# Patient Record
Sex: Male | Born: 2000 | Race: Black or African American | Hispanic: No | Marital: Single | Smoking: Former smoker
Health system: Southern US, Community
[De-identification: ages and names within clinical notes are randomized; demographics above are authoritative.]

## PROBLEM LIST (undated history)

## (undated) DIAGNOSIS — D571 Sickle-cell disease without crisis: Secondary | ICD-10-CM

## (undated) HISTORY — PX: NO PAST SURGERIES: SHX2092

---
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
## 2018-01-06 ENCOUNTER — Encounter: Payer: Self-pay | Admitting: *Deleted

## 2018-01-06 ENCOUNTER — Emergency Department
Admission: EM | Admit: 2018-01-06 | Discharge: 2018-01-06 | Disposition: A | Discharge status: Home / Self Care | Payer: Medicaid Other | Attending: Emergency Medicine | Admitting: Emergency Medicine

## 2018-01-06 ENCOUNTER — Other Ambulatory Visit: Payer: Self-pay

## 2018-01-06 ENCOUNTER — Emergency Department
Admission: EM | Admit: 2018-01-06 | Discharge: 2018-01-07 | Disposition: A | Discharge status: Home / Self Care | Payer: Medicaid Other | Source: Home / Self Care | Attending: Emergency Medicine | Admitting: Emergency Medicine

## 2018-01-06 ENCOUNTER — Encounter: Payer: Self-pay | Admitting: Emergency Medicine

## 2018-01-06 DIAGNOSIS — M545 Low back pain: Secondary | ICD-10-CM | POA: Diagnosis not present

## 2018-01-06 DIAGNOSIS — F17200 Nicotine dependence, unspecified, uncomplicated: Secondary | ICD-10-CM

## 2018-01-06 DIAGNOSIS — Z79899 Other long term (current) drug therapy: Secondary | ICD-10-CM | POA: Insufficient documentation

## 2018-01-06 DIAGNOSIS — D5700 Hb-SS disease with crisis, unspecified: Secondary | ICD-10-CM | POA: Insufficient documentation

## 2018-01-06 LAB — CBC WITH DIFFERENTIAL/PLATELET
Abs Immature Granulocytes: 0.03 10*3/uL (ref 0.00–0.07)
BASOS ABS: 0.1 10*3/uL (ref 0.0–0.1)
Basophils Relative: 1 %
EOS PCT: 12 %
Eosinophils Absolute: 1.4 10*3/uL — ABNORMAL HIGH (ref 0.0–0.5)
HEMATOCRIT: 28.5 % — AB (ref 39.0–52.0)
HEMOGLOBIN: 10 g/dL — AB (ref 13.0–17.0)
Immature Granulocytes: 0 %
LYMPHS ABS: 3.4 10*3/uL (ref 0.7–4.0)
LYMPHS PCT: 30 %
MCH: 30.4 pg (ref 26.0–34.0)
MCHC: 35.1 g/dL (ref 30.0–36.0)
MCV: 86.6 fL (ref 80.0–100.0)
MONO ABS: 1.6 10*3/uL — AB (ref 0.1–1.0)
MONOS PCT: 14 %
Neutro Abs: 4.9 10*3/uL (ref 1.7–7.7)
Neutrophils Relative %: 43 %
Platelets: 342 10*3/uL (ref 150–400)
RBC: 3.29 MIL/uL — ABNORMAL LOW (ref 4.22–5.81)
RDW: 19.6 % — ABNORMAL HIGH (ref 11.5–15.5)
WBC: 11.4 10*3/uL — ABNORMAL HIGH (ref 4.0–10.5)
nRBC: 1 % — ABNORMAL HIGH (ref 0.0–0.2)

## 2018-01-06 LAB — RETICULOCYTES
Immature Retic Fract: 34.1 % — ABNORMAL HIGH (ref 2.3–15.9)
RBC.: 3.29 MIL/uL — AB (ref 4.22–5.81)
Retic Count, Absolute: 410.6 10*3/uL — ABNORMAL HIGH (ref 19.0–186.0)
Retic Ct Pct: 12.5 % — ABNORMAL HIGH (ref 0.4–3.1)

## 2018-01-06 LAB — COMPREHENSIVE METABOLIC PANEL
ALK PHOS: 58 U/L (ref 38–126)
ALT: 24 U/L (ref 0–44)
AST: 39 U/L (ref 15–41)
Albumin: 4.9 g/dL (ref 3.5–5.0)
Anion gap: 8 (ref 5–15)
BUN: 8 mg/dL (ref 6–20)
CHLORIDE: 107 mmol/L (ref 98–111)
CO2: 26 mmol/L (ref 22–32)
CREATININE: 0.72 mg/dL (ref 0.61–1.24)
Calcium: 9.5 mg/dL (ref 8.9–10.3)
GFR calc Af Amer: >60 mL/min (ref >60)
GFR calc non Af Amer: >60 mL/min (ref >60)
GLUCOSE: 89 mg/dL (ref 70–99)
Potassium: 4.1 mmol/L (ref 3.5–5.1)
SODIUM: 141 mmol/L (ref 135–145)
Total Bilirubin: 1.2 mg/dL (ref 0.3–1.2)
Total Protein: 8.7 g/dL — ABNORMAL HIGH (ref 6.5–8.1)

## 2018-01-06 MED ORDER — HYDROMORPHONE HCL 1 MG/ML IJ SOLN
2.0000 mg | Freq: Once | INTRAMUSCULAR | Status: AC
  Administered 2018-01-07: 2 mg via SUBCUTANEOUS
  Filled 2018-01-06: qty 2

## 2018-01-06 MED ORDER — IBUPROFEN 600 MG PO TABS
600.0000 mg | ORAL_TABLET | Freq: Once | ORAL | Status: AC
  Administered 2018-01-07: 600 mg via ORAL
  Filled 2018-01-06: qty 1

## 2018-01-06 MED ORDER — HYDROXYUREA 500 MG PO CAPS: 1000.0000 mg | ORAL_CAPSULE | Freq: Every day | ORAL | 0 refills | Status: AC

## 2018-01-06 MED ORDER — OXYCODONE HCL 5 MG PO TABS: 5.0000 mg | ORAL_TABLET | Freq: Four times a day (QID) | ORAL | 0 refills | Status: AC | PRN

## 2018-01-06 MED ORDER — OXYCODONE HCL ER 40 MG PO T12A
40.0000 mg | EXTENDED_RELEASE_TABLET | Freq: Once | ORAL | Status: AC
  Administered 2018-01-07: 40 mg via ORAL
  Filled 2018-01-06: qty 1

## 2018-01-06 NOTE — Discharge Instructions (Signed)
Please resume taking your hydroxyurea every day to help prevent these crises from happening in the first place.  Take your pain medication as needed for severe symptoms and make sure you remain well-hydrated.  Follow-up with your primary care physician as needed and return to the emergency department for any concerns.  It was a pleasure to take care of you today, and thank you for coming to our emergency department.  If you have any questions or concerns before leaving please ask the nurse to grab me and I'm more than happy to go through your aftercare instructions again.  If you were prescribed any opioid pain medication today such as Norco, Vicodin, Percocet, morphine, hydrocodone, or oxycodone please make sure you do not drive when you are taking this medication as it can alter your ability to drive safely.  If you have any concerns once you are home that you are not improving or are in fact getting worse before you can make it to your follow-up appointment, please do not hesitate to call [phone] and come back for further evaluation.  Merrily Brittle, MD  Results for orders placed or performed during the hospital encounter of 01/06/18  Comprehensive metabolic panel  Result Value Ref Range   Sodium 141 135 - 145 mmol/L   Potassium 4.1 3.5 - 5.1 mmol/L   Chloride 107 98 - 111 mmol/L   CO2 26 22 - 32 mmol/L   Glucose, Bld 89 70 - 99 mg/dL   BUN 8 6 - 20 mg/dL   Creatinine, Ser 4.09 0.61 - 1.24 mg/dL   Calcium 9.5 8.9 - 81.1 mg/dL   Total Protein 8.7 (H) 6.5 - 8.1 g/dL   Albumin 4.9 3.5 - 5.0 g/dL   AST 39 15 - 41 U/L   ALT 24 0 - 44 U/L   Alkaline Phosphatase 58 38 - 126 U/L   Total Bilirubin 1.2 0.3 - 1.2 mg/dL   GFR calc non Af Amer >60 >60 mL/min   GFR calc Af Amer >60 >60 mL/min   Anion gap 8 5 - 15  CBC with Differential  Result Value Ref Range   WBC 11.4 (H) 4.0 - 10.5 K/uL   RBC 3.29 (L) 4.22 - 5.81 MIL/uL   Hemoglobin 10.0 (L) 13.0 - 17.0 g/dL   HCT 91.4 (L) 78.2 - 95.6 %   MCV  86.6 80.0 - 100.0 fL   MCH 30.4 26.0 - 34.0 pg   MCHC 35.1 30.0 - 36.0 g/dL   RDW 21.3 (H) 08.6 - 57.8 %   Platelets 342 150 - 400 K/uL   nRBC 1.0 (H) 0.0 - 0.2 %   Neutrophils Relative % 43 %   Neutro Abs 4.9 1.7 - 7.7 K/uL   Lymphocytes Relative 30 %   Lymphs Abs 3.4 0.7 - 4.0 K/uL   Monocytes Relative 14 %   Monocytes Absolute 1.6 (H) 0.1 - 1.0 K/uL   Eosinophils Relative 12 %   Eosinophils Absolute 1.4 (H) 0.0 - 0.5 K/uL   Basophils Relative 1 %   Basophils Absolute 0.1 0.0 - 0.1 K/uL   Immature Granulocytes 0 %   Abs Immature Granulocytes 0.03 0.00 - 0.07 K/uL  Reticulocytes  Result Value Ref Range   Retic Ct Pct 12.5 (H) 0.4 - 3.1 %   RBC. 3.29 (L) 4.22 - 5.81 MIL/uL   Retic Count, Absolute 410.6 (H) 19.0 - 186.0 K/uL   Immature Retic Fract 34.1 (H) 2.3 - 15.9 %

## 2018-01-06 NOTE — ED Triage Notes (Signed)
Patient was here earlier but had to leave and came back. Patient is having back pain that has been going on for 3 weeks and has hx of sickle cell.

## 2018-01-06 NOTE — Discharge Instructions (Addendum)
Please return to the ER for treatment with fluids and pain medication as soon as you are able.  Return to the ER immediately for any new or worsening symptoms that concern you.

## 2018-01-06 NOTE — ED Triage Notes (Signed)
Pt c/o low back pain on-going x 3 weeks. Pt states he might be having a sickle cell crisis. Pt states back pain consistent w/ sickle cell crisis in past, but pain is more pervasive and in more areas of his body. Pt states he usually is treated for his sickle cell at [facility]. Pt also has significant other who has checked into ED today for different reasons. Pt denies shortness of breath at this time.

## 2018-01-06 NOTE — ED Provider Notes (Signed)
[facility] Emergency Department Provider Note  ____________________________________________   First MD Initiated Contact with Patient 01/06/18 2300     (approximate)  I have reviewed the triage vital signs and the nursing notes.   HISTORY  Chief Complaint No chief complaint on file.   HPI Andrew Kirk is a 17 y.o. male with a past medical history of sickle cell disease who comes to the emergency department with 3 weeks of daily moderate severity cramping aching low back pain which he attributes to his sickle cell pain crisis.  He has not taken his hydroxyurea in "a long time" because he wanted to see if he could live without it.  He was actually seen in our emergency department earlier today and taking care of by my partner Dr. Marisa Severin however the patient is under house arrest and his ankle monitor started to go off and he needed to go home to change the battery.  He returns now with persistent symptoms.  He denies chest pain or shortness of breath.  His pain is moderate in severity constant worse with bending or stooping improved with rest.  No fevers or chills.  No urinary incontinence or hesitance.  Normal perianal sensation.  He is requesting a note for his parole officer for when he leaves here today and he would like to go home.  His pain is nonradiating.  He is taken over-the-counter Tylenol with essentially no relief.    Past Medical History:  Diagnosis Date  . Sickle cell anemia (HCC)     There are no active problems to display for this patient.   No past surgical history on file.  Prior to Admission medications   Medication Sig Start Date End Date Taking? Authorizing Provider  hydroxyurea (HYDREA) 500 MG capsule Take 2 capsules (1,000 mg total) by mouth daily. 01/06/18 02/05/18  Merrily Brittle, MD  ibuprofen (ADVIL,MOTRIN) 600 MG tablet Take 1 tablet (600 mg total) by mouth every 8 (eight) hours as needed. 01/20/16   Joni Reining, PA-C    oxyCODONE (OXY IR/ROXICODONE) 5 MG immediate release tablet Take 1 tablet (5 mg total) by mouth every 6 (six) hours as needed for up to 5 days for severe pain. 01/06/18 01/11/18  Merrily Brittle, MD    Allergies Patient has no known allergies.  No family history on file.  Social History Social History   Tobacco Use  . Smoking status: Current Every Day Smoker  . Smokeless tobacco: Never Used  Substance Use Topics  . Alcohol use: Yes  . Drug use: Not on file    Review of Systems Constitutional: No fever/chills Eyes: No visual changes. ENT: No sore throat. Cardiovascular: Denies chest pain. Respiratory: Denies shortness of breath. Gastrointestinal: No abdominal pain.  No nausea, no vomiting.  No diarrhea.  No constipation. Genitourinary: Negative for dysuria. Musculoskeletal: Positive for back pain. Skin: Negative for rash. Neurological: Negative for headaches, focal weakness or numbness.   ____________________________________________   PHYSICAL EXAM:  VITAL SIGNS: ED Triage Vitals  Enc Vitals Group     BP 01/06/18 1947 114/63     Pulse Rate 01/06/18 1947 93     Resp 01/06/18 1947 19     Temp 01/06/18 1947 99.1 F (37.3 C)     Temp Source 01/06/18 1947 Oral     SpO2 01/06/18 1947 97 %     Weight 01/06/18 1948 170 lb (77.1 kg)     Height 01/06/18 1948 5\' 7"  (1.702 m)  Head Circumference --      Peak Flow --      Pain Score 01/06/18 1948 6     Pain Loc --      Pain Edu? --      Excl. in GC? --     Constitutional: Alert and oriented x4 appears somewhat uncomfortable and stiff although nontoxic no diaphoresis Eyes: PERRL EOMI. Head: Atraumatic. Nose: No congestion/rhinnorhea. Mouth/Throat: No trismus Neck: No stridor.   Cardiovascular: Normal rate, regular rhythm. Grossly normal heart sounds.  Good peripheral circulation. Respiratory: Normal respiratory effort.  No retractions. Lungs CTAB and moving good air Gastrointestinal: Soft  nontender Musculoskeletal: No midline back tenderness and no muscle spasm Neurologic:  Normal speech and language. No gross focal neurologic deficits are appreciated. Skin:  Skin is warm, dry and intact. No rash noted. Psychiatric: Mood and affect are normal. Speech and behavior are normal.    ____________________________________________   DIFFERENTIAL includes but not limited to  Sickle cell pain crisis, sequestration crisis, acute chest syndrome ____________________________________________   LABS (all labs ordered are listed, but only abnormal results are displayed)  Labs Reviewed - No data to display  Lab work from earlier today reviewed by me shows no acute sequestration crisis __________________________________________  EKG   ____________________________________________  RADIOLOGY   ____________________________________________   PROCEDURES  Procedure(s) performed: no  Procedures  Critical Care performed: no  ____________________________________________   INITIAL IMPRESSION / ASSESSMENT AND PLAN / ED COURSE  Pertinent labs & imaging results that were available during my care of the patient were reviewed by me and considered in my medical decision making (see chart for details).   As part of my medical decision making, I reviewed the following data within the electronic MEDICAL RECORD NUMBER History obtained from family if available, nursing notes, old chart and ekg, as well as notes from prior ED visits.  The patient appears uncomfortable although not diaphoretic and not in distress.  We discussed the importance of resuming hydroxyurea at home and the importance of nonsteroidals and remaining well-hydrated.  He is not driving home today and he has a job interview in the morning which he would like to get to.  All pharmacies in the local area are currently closed so we will give him 2 mg of Dilaudid subcutaneously as well as an oral dose of OxyContin which is longer  acting to get him through the night.  I will refill his hydroxyurea and give him 15 tablets of short acting oxycodone and refer him back to his primary care physician.  Strict return precautions have been given.      ____________________________________________   FINAL CLINICAL IMPRESSION(S) / ED DIAGNOSES  Final diagnoses:  Sickle cell pain crisis (HCC)      NEW MEDICATIONS STARTED DURING THIS VISIT:  New Prescriptions   OXYCODONE (OXY IR/ROXICODONE) 5 MG IMMEDIATE RELEASE TABLET    Take 1 tablet (5 mg total) by mouth every 6 (six) hours as needed for up to 5 days for severe pain.     Note:  This document was prepared using Dragon voice recognition software and may include unintentional dictation errors.     Merrily Brittle, MD 01/06/18 2316

## 2018-01-06 NOTE — ED Provider Notes (Signed)
[facility] Emergency Department Provider Note ____________________________________________   First MD Initiated Contact with Patient 01/06/18 1813     (approximate)  I have reviewed the triage vital signs and the nursing notes.   HISTORY  Chief Complaint Sickle Cell Pain Crisis    HPI Andrew Kirk is a 17 y.o. male with PMH of sickle cell disease who presents with back pain over the last 3 weeks, worsening in intensity, and located in the lower back mainly on the right side.  He states he has had similar pain in the past from his sickle cell and this is consistent.  He denies associated numbness or weakness, fevers, shortness of breath, or any other acute symptoms.  The patient states he only takes over-the-counter Tylenol for pain.  He last went to the emergency department several months ago.  Past Medical History:  Diagnosis Date  . Sickle cell anemia (HCC)     There are no active problems to display for this patient.   History reviewed. No pertinent surgical history.  Prior to Admission medications   Medication Sig Start Date End Date Taking? Authorizing Provider  hydroxyurea (HYDREA) 500 MG capsule Take 1,000 mg by mouth daily. 10/26/15   [provider]  ibuprofen (ADVIL,MOTRIN) 600 MG tablet Take 1 tablet (600 mg total) by mouth every 8 (eight) hours as needed. 01/20/16   Joni Reining, PA-C    Allergies Patient has no known allergies.  History reviewed. No pertinent family history.  Social History Social History   Tobacco Use  . Smoking status: Current Every Day Smoker  . Smokeless tobacco: Never Used  Substance Use Topics  . Alcohol use: Yes  . Drug use: Not on file    Review of Systems  Constitutional: No fever. Eyes: No redness. ENT: No neck pain. Cardiovascular: Denies chest pain. Respiratory: Denies shortness of breath. Gastrointestinal: No vomiting.  Genitourinary: Negative for flank pain.  Musculoskeletal:  Positive for back pain. Skin: Negative for rash. Neurological: Negative for headaches.   ____________________________________________   PHYSICAL EXAM:  VITAL SIGNS: ED Triage Vitals  Enc Vitals Group     BP 01/06/18 1651 130/72     Pulse Rate 01/06/18 1651 88     Resp 01/06/18 1651 20     Temp 01/06/18 1651 98.7 F (37.1 C)     Temp Source 01/06/18 1651 Oral     SpO2 01/06/18 1651 94 %     Weight 01/06/18 1652 170 lb (77.1 kg)     Height 01/06/18 1652 5\' 7"  (1.702 m)     Head Circumference --      Peak Flow --      Pain Score 01/06/18 1651 4     Pain Loc --      Pain Edu? --      Excl. in GC? --     Constitutional: Alert and oriented.  Relatively well appearing and in no acute distress. Eyes: Conjunctivae are slightly pale.  Head: Atraumatic. Nose: No congestion/rhinnorhea. Mouth/Throat: Mucous membranes are moist.   Neck: Normal range of motion.  Cardiovascular: Good peripheral circulation. Respiratory: Normal respiratory effort.  Gastrointestinal: No distention.  Genitourinary: No CVA tenderness. Musculoskeletal: No lower extremity edema.  Extremities warm and well perfused.  No midline spinal tenderness.  Mild right lumbar paraspinal tenderness. Neurologic:  Normal speech and language. No gross focal neurologic deficits are appreciated.  Skin:  Skin is warm and dry. No rash noted. Psychiatric: Mood and affect are normal. Speech and  behavior are normal.  ____________________________________________   LABS (all labs ordered are listed, but only abnormal results are displayed)  Labs Reviewed  COMPREHENSIVE METABOLIC PANEL - Abnormal; Notable for the following components:      Result Value   Total Protein 8.7 (*)    All other components within normal limits  CBC WITH DIFFERENTIAL/PLATELET - Abnormal; Notable for the following components:   WBC 11.4 (*)    RBC 3.29 (*)    Hemoglobin 10.0 (*)    HCT 28.5 (*)    RDW 19.6 (*)    nRBC 1.0 (*)    Monocytes  Absolute 1.6 (*)    Eosinophils Absolute 1.4 (*)    All other components within normal limits  RETICULOCYTES - Abnormal; Notable for the following components:   Retic Ct Pct 12.5 (*)    RBC. 3.29 (*)    Retic Count, Absolute 410.6 (*)    Immature Retic Fract 34.1 (*)    All other components within normal limits   ____________________________________________  EKG   ____________________________________________  RADIOLOGY    ____________________________________________   PROCEDURES  Procedure(s) performed: No  Procedures  Critical Care performed: No ____________________________________________   INITIAL IMPRESSION / ASSESSMENT AND PLAN / ED COURSE  Pertinent labs & imaging results that were available during my care of the patient were reviewed by me and considered in my medical decision making (see chart for details).  17 year old male with history of sickle cell disease presents with lower back pain over the last few weeks which he is concerned is developing into a vaso-occlusive crisis.  He only takes Tylenol at home for pain.  I reviewed the past medical records in epic; the patient primarily follows at [facility].  He has a diagnosis of sickle cell  SS.  He was most recently seen in the ED here in 2017, and was seen in the [facility] ED in May.  On exam he is relatively well-appearing and his vital signs are normal.  The remainder of the exam is unremarkable.  Overall the presentation is consistent with sickle cell related pain.  This patient appears to have relatively well-controlled pain and infrequent ED and hospital visits.  We will obtain labs, give fluids, and analgesia.  ----------------------------------------- 6:43 PM on 01/06/2018 -----------------------------------------  The patient's lab work-up is unremarkable.  His hemoglobin is near his baseline.  His reticulocyte count is appropriate.  Before he got pain medication, the patient's ankle bracelet started to  alarm because the battery was out.  The patient does not have a replacement battery with him, and he tried to call his parole officer but was not able to get in touch.  The patient states that he needs to go home to change out the battery immediately.  Given his stable vital signs and negative work-up, I feel that this is appropriate as he is only waiting for pain control and hydration.  I counseled him on the results of the work-up and instructed him to return as soon as possible to resume his treatment.  He agrees to return. ____________________________________________   FINAL CLINICAL IMPRESSION(S) / ED DIAGNOSES  Final diagnoses:  Sickle cell anemia with pain (HCC)      NEW MEDICATIONS STARTED DURING THIS VISIT:  New Prescriptions   No medications on file     Note:  This document was prepared using Dragon voice recognition software and may include unintentional dictation errors.    Dionne Bucy, MD 01/06/18 [phone]

## 2018-01-06 NOTE — ED Notes (Signed)
Pt reports back pain for 3 weeks.  Pt is taking tylenol without pain relief.  Hx sickle cell.  Pt denies injury to back.  Denies urinary sx.  md in with pt.  Pt alert.

## 2018-01-07 NOTE — ED Notes (Signed)
Pt states pain in back of "6/10". Pt states he just wants the medication so he can go home.

## 2018-04-23 ENCOUNTER — Encounter: Payer: Self-pay | Admitting: Emergency Medicine

## 2018-04-23 ENCOUNTER — Other Ambulatory Visit: Payer: Self-pay

## 2018-04-23 ENCOUNTER — Ambulatory Visit
Admission: EM | Admit: 2018-04-23 | Discharge: 2018-04-23 | Disposition: A | Discharge status: Home / Self Care | Payer: Medicaid Other | Attending: Family Medicine | Admitting: Family Medicine

## 2018-04-23 ENCOUNTER — Ambulatory Visit: Payer: Medicaid Other

## 2018-04-23 DIAGNOSIS — S6991XA Unspecified injury of right wrist, hand and finger(s), initial encounter: Secondary | ICD-10-CM | POA: Diagnosis not present

## 2018-04-23 DIAGNOSIS — W228XXA Striking against or struck by other objects, initial encounter: Secondary | ICD-10-CM

## 2018-04-23 MED ORDER — MELOXICAM 15 MG PO TABS: 15.0000 mg | ORAL_TABLET | Freq: Every day | ORAL | 0 refills | Status: AC | PRN
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
## 2018-04-23 NOTE — Discharge Instructions (Signed)
Medication as prescribed.  Rest, ice, elevate.  Take care  Dr. Adriana Simas

## 2018-07-04 ENCOUNTER — Other Ambulatory Visit: Payer: Self-pay

## 2018-07-04 ENCOUNTER — Ambulatory Visit
Admission: EM | Admit: 2018-07-04 | Discharge: 2018-07-04 | Disposition: A | Discharge status: Home / Self Care | Payer: Medicaid Other | Attending: Family Medicine | Admitting: Family Medicine

## 2018-07-04 ENCOUNTER — Encounter: Payer: Self-pay | Admitting: Emergency Medicine

## 2018-07-04 ENCOUNTER — Ambulatory Visit: Payer: Medicaid Other

## 2018-07-04 DIAGNOSIS — S93601A Unspecified sprain of right foot, initial encounter: Secondary | ICD-10-CM | POA: Diagnosis not present

## 2018-07-04 DIAGNOSIS — S9031XA Contusion of right foot, initial encounter: Secondary | ICD-10-CM

## 2018-07-04 DIAGNOSIS — M79671 Pain in right foot: Secondary | ICD-10-CM | POA: Diagnosis not present

## 2018-07-04 MED ORDER — HYDROCODONE-ACETAMINOPHEN 5-325 MG PO TABS: ORAL_TABLET | ORAL | 0 refills | Status: AC
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
## 2019-04-18 DIAGNOSIS — M79641 Pain in right hand: Secondary | ICD-10-CM | POA: Diagnosis not present

## 2019-04-18 DIAGNOSIS — M7989 Other specified soft tissue disorders: Secondary | ICD-10-CM | POA: Diagnosis not present

## 2019-04-18 DIAGNOSIS — S6991XA Unspecified injury of right wrist, hand and finger(s), initial encounter: Secondary | ICD-10-CM | POA: Diagnosis not present

## 2019-05-08 DIAGNOSIS — D5700 Hb-SS disease with crisis, unspecified: Secondary | ICD-10-CM | POA: Diagnosis not present

## 2019-05-08 DIAGNOSIS — D72829 Elevated white blood cell count, unspecified: Secondary | ICD-10-CM | POA: Diagnosis not present

## 2019-05-08 DIAGNOSIS — R519 Headache, unspecified: Secondary | ICD-10-CM | POA: Diagnosis not present

## 2019-05-08 DIAGNOSIS — M545 Low back pain: Secondary | ICD-10-CM | POA: Diagnosis not present

## 2019-05-13 DIAGNOSIS — D571 Sickle-cell disease without crisis: Secondary | ICD-10-CM | POA: Diagnosis not present

## 2020-02-26 DIAGNOSIS — Z862 Personal history of diseases of the blood and blood-forming organs and certain disorders involving the immune mechanism: Secondary | ICD-10-CM | POA: Diagnosis not present

## 2020-02-26 DIAGNOSIS — R509 Fever, unspecified: Secondary | ICD-10-CM | POA: Diagnosis not present

## 2020-02-26 DIAGNOSIS — Z20822 Contact with and (suspected) exposure to covid-19: Secondary | ICD-10-CM | POA: Diagnosis not present

## 2020-02-26 DIAGNOSIS — D5700 Hb-SS disease with crisis, unspecified: Secondary | ICD-10-CM | POA: Diagnosis not present

## 2020-02-26 DIAGNOSIS — I499 Cardiac arrhythmia, unspecified: Secondary | ICD-10-CM | POA: Diagnosis not present

## 2020-02-26 DIAGNOSIS — M546 Pain in thoracic spine: Secondary | ICD-10-CM | POA: Diagnosis not present

## 2020-02-26 DIAGNOSIS — M549 Dorsalgia, unspecified: Secondary | ICD-10-CM | POA: Diagnosis not present

## 2020-03-16 DIAGNOSIS — D5700 Hb-SS disease with crisis, unspecified: Secondary | ICD-10-CM | POA: Diagnosis not present

## 2020-04-14 DIAGNOSIS — R059 Cough, unspecified: Secondary | ICD-10-CM | POA: Diagnosis not present

## 2020-04-14 DIAGNOSIS — D5700 Hb-SS disease with crisis, unspecified: Secondary | ICD-10-CM | POA: Diagnosis not present

## 2020-04-14 DIAGNOSIS — R0902 Hypoxemia: Secondary | ICD-10-CM | POA: Diagnosis not present

## 2020-04-14 DIAGNOSIS — R519 Headache, unspecified: Secondary | ICD-10-CM | POA: Diagnosis not present

## 2020-04-14 DIAGNOSIS — R079 Chest pain, unspecified: Secondary | ICD-10-CM | POA: Diagnosis not present

## 2020-04-14 DIAGNOSIS — R52 Pain, unspecified: Secondary | ICD-10-CM | POA: Diagnosis not present

## 2020-04-27 DIAGNOSIS — D5700 Hb-SS disease with crisis, unspecified: Secondary | ICD-10-CM | POA: Diagnosis not present

## 2021-02-02 ENCOUNTER — Inpatient Hospital Stay (HOSPITAL_COMMUNITY)
Admission: AD | Admit: 2021-02-02 | Discharge: 2021-02-08 | DRG: 812 | Discharge status: Against Medical Advice | Payer: Medicaid Other | Source: Other Acute Inpatient Hospital | Attending: Internal Medicine | Admitting: Internal Medicine

## 2021-02-02 ENCOUNTER — Emergency Department: Payer: Medicaid Other

## 2021-02-02 ENCOUNTER — Inpatient Hospital Stay
Admission: AD | Admit: 2021-02-02 | Payer: Medicaid Other | Source: Other Acute Inpatient Hospital | Admitting: Internal Medicine

## 2021-02-02 ENCOUNTER — Encounter: Payer: Self-pay | Admitting: Emergency Medicine

## 2021-02-02 ENCOUNTER — Other Ambulatory Visit: Payer: Self-pay

## 2021-02-02 ENCOUNTER — Observation Stay
Admission: EM | Admit: 2021-02-02 | Discharge: 2021-02-02 | Disposition: A | Discharge status: Home / Self Care | Payer: Medicaid Other | Attending: Internal Medicine | Admitting: Internal Medicine

## 2021-02-02 DIAGNOSIS — D5700 Hb-SS disease with crisis, unspecified: Principal | ICD-10-CM | POA: Diagnosis present

## 2021-02-02 DIAGNOSIS — Z20822 Contact with and (suspected) exposure to covid-19: Secondary | ICD-10-CM | POA: Insufficient documentation

## 2021-02-02 DIAGNOSIS — Z5329 Procedure and treatment not carried out because of patient's decision for other reasons: Secondary | ICD-10-CM | POA: Diagnosis present

## 2021-02-02 DIAGNOSIS — R0789 Other chest pain: Secondary | ICD-10-CM | POA: Insufficient documentation

## 2021-02-02 DIAGNOSIS — Z87891 Personal history of nicotine dependence: Secondary | ICD-10-CM | POA: Diagnosis not present

## 2021-02-02 DIAGNOSIS — R042 Hemoptysis: Secondary | ICD-10-CM | POA: Diagnosis not present

## 2021-02-02 DIAGNOSIS — Z8249 Family history of ischemic heart disease and other diseases of the circulatory system: Secondary | ICD-10-CM | POA: Diagnosis not present

## 2021-02-02 DIAGNOSIS — D571 Sickle-cell disease without crisis: Secondary | ICD-10-CM | POA: Diagnosis not present

## 2021-02-02 DIAGNOSIS — G894 Chronic pain syndrome: Secondary | ICD-10-CM | POA: Diagnosis present

## 2021-02-02 DIAGNOSIS — R079 Chest pain, unspecified: Secondary | ICD-10-CM | POA: Diagnosis not present

## 2021-02-02 DIAGNOSIS — J90 Pleural effusion, not elsewhere classified: Secondary | ICD-10-CM | POA: Diagnosis present

## 2021-02-02 DIAGNOSIS — J101 Influenza due to other identified influenza virus with other respiratory manifestations: Secondary | ICD-10-CM | POA: Diagnosis present

## 2021-02-02 LAB — BASIC METABOLIC PANEL
Anion gap: 5 (ref 5–15)
BUN: 11 mg/dL (ref 6–20)
CO2: 24 mmol/L (ref 22–32)
Calcium: 8.9 mg/dL (ref 8.9–10.3)
Chloride: 108 mmol/L (ref 98–111)
Creatinine, Ser: 0.63 mg/dL (ref 0.61–1.24)
GFR, Estimated: >60 mL/min (ref >60)
Glucose, Bld: 101 mg/dL — ABNORMAL HIGH (ref 70–99)
Potassium: 4 mmol/L (ref 3.5–5.1)
Sodium: 137 mmol/L (ref 135–145)

## 2021-02-02 LAB — CBC WITH DIFFERENTIAL/PLATELET
Abs Immature Granulocytes: 0.45 10*3/uL — ABNORMAL HIGH (ref 0.00–0.07)
Basophils Absolute: 0 10*3/uL (ref 0.0–0.1)
Basophils Relative: 0 %
Eosinophils Absolute: 0.4 10*3/uL (ref 0.0–0.5)
Eosinophils Relative: 4 %
HCT: 23.8 % — ABNORMAL LOW (ref 39.0–52.0)
Hemoglobin: 8.5 g/dL — ABNORMAL LOW (ref 13.0–17.0)
Immature Granulocytes: 4 %
Lymphocytes Relative: 40 %
Lymphs Abs: 4.8 10*3/uL — ABNORMAL HIGH (ref 0.7–4.0)
MCH: 30.9 pg (ref 26.0–34.0)
MCHC: 35.7 g/dL (ref 30.0–36.0)
MCV: 86.5 fL (ref 80.0–100.0)
Monocytes Absolute: 1.5 10*3/uL — ABNORMAL HIGH (ref 0.1–1.0)
Monocytes Relative: 12 %
Neutro Abs: 4.7 10*3/uL (ref 1.7–7.7)
Neutrophils Relative %: 40 %
Platelets: 291 10*3/uL (ref 150–400)
RBC: 2.75 MIL/uL — ABNORMAL LOW (ref 4.22–5.81)
RDW: 21.2 % — ABNORMAL HIGH (ref 11.5–15.5)
Smear Review: NORMAL
WBC: 11.9 10*3/uL — ABNORMAL HIGH (ref 4.0–10.5)
nRBC: 3.8 % — ABNORMAL HIGH (ref 0.0–0.2)

## 2021-02-02 LAB — RESP PANEL BY RT-PCR (FLU A&B, COVID) ARPGX2
Influenza A by PCR: POSITIVE — AB
Influenza B by PCR: NEGATIVE
SARS Coronavirus 2 by RT PCR: NEGATIVE

## 2021-02-02 LAB — TROPONIN I (HIGH SENSITIVITY)
Troponin I (High Sensitivity): 23 ng/L — ABNORMAL HIGH (ref <18)
Troponin I (High Sensitivity): 15 ng/L (ref <18)

## 2021-02-02 LAB — RETICULOCYTES
Immature Retic Fract: 40.9 % — ABNORMAL HIGH (ref 2.3–15.9)
RBC.: 2.79 MIL/uL — ABNORMAL LOW (ref 4.22–5.81)
Retic Count, Absolute: 293 10*3/uL — ABNORMAL HIGH (ref 19.0–186.0)
Retic Ct Pct: 10.5 % — ABNORMAL HIGH (ref 0.4–3.1)

## 2021-02-02 MED ORDER — LIDOCAINE 5 % EX PTCH
1.0000 | MEDICATED_PATCH | CUTANEOUS | Status: DC
  Administered 2021-02-02: 1 via TRANSDERMAL
  Filled 2021-02-02: qty 1

## 2021-02-02 MED ORDER — MORPHINE SULFATE (PF) 4 MG/ML IV SOLN
4.0000 mg | INTRAVENOUS | Status: DC | PRN
  Administered 2021-02-02 (×3): 4 mg via INTRAVENOUS
  Filled 2021-02-02 (×3): qty 1

## 2021-02-02 MED ORDER — FENTANYL CITRATE PF 50 MCG/ML IJ SOSY: 100.0000 ug | PREFILLED_SYRINGE | Freq: Once | INTRAMUSCULAR | Status: AC

## 2021-02-02 MED ORDER — FAMOTIDINE 20 MG PO TABS
20.0000 mg | ORAL_TABLET | Freq: Two times a day (BID) | ORAL | Status: DC
  Administered 2021-02-02 – 2021-02-05 (×5): 20 mg via ORAL
  Filled 2021-02-02 (×12): qty 1

## 2021-02-02 MED ORDER — LACTATED RINGERS IV BOLUS
1000.0000 mL | Freq: Once | INTRAVENOUS | Status: AC
  Administered 2021-02-02: 1000 mL via INTRAVENOUS

## 2021-02-02 MED ORDER — SODIUM CHLORIDE 0.45 % IV SOLN: INTRAVENOUS | Status: DC

## 2021-02-02 MED ORDER — KETOROLAC TROMETHAMINE 30 MG/ML IJ SOLN
15.0000 mg | INTRAMUSCULAR | Status: AC
  Administered 2021-02-02: 15 mg via INTRAVENOUS
  Filled 2021-02-02: qty 1

## 2021-02-02 MED ORDER — HYDROMORPHONE HCL 1 MG/ML IJ SOLN
2.0000 mg | INTRAMUSCULAR | Status: DC | PRN
  Administered 2021-02-02 (×2): 2 mg via INTRAVENOUS
  Filled 2021-02-02 (×2): qty 2

## 2021-02-02 MED ORDER — ONDANSETRON HCL 4 MG/2ML IJ SOLN
4.0000 mg | Freq: Once | INTRAMUSCULAR | Status: AC
  Administered 2021-02-02: 4 mg via INTRAVENOUS
  Filled 2021-02-02: qty 2

## 2021-02-02 MED ORDER — OSELTAMIVIR PHOSPHATE 75 MG PO CAPS
75.0000 mg | ORAL_CAPSULE | Freq: Two times a day (BID) | ORAL | Status: AC
  Administered 2021-02-02 – 2021-02-07 (×10): 75 mg via ORAL
  Filled 2021-02-02 (×11): qty 1

## 2021-02-02 MED ORDER — HYDROMORPHONE HCL 1 MG/ML IJ SOLN
1.0000 mg | Freq: Once | INTRAMUSCULAR | Status: AC
  Administered 2021-02-02: 1 mg via SUBCUTANEOUS
  Filled 2021-02-02: qty 1

## 2021-02-02 MED ORDER — SENNOSIDES-DOCUSATE SODIUM 8.6-50 MG PO TABS
1.0000 | ORAL_TABLET | Freq: Two times a day (BID) | ORAL | Status: DC
  Administered 2021-02-02 – 2021-02-08 (×9): 1 via ORAL
  Filled 2021-02-02 (×12): qty 1

## 2021-02-02 MED ORDER — METHOCARBAMOL 500 MG PO TABS
500.0000 mg | ORAL_TABLET | Freq: Once | ORAL | Status: AC
  Administered 2021-02-02: 500 mg via ORAL
  Filled 2021-02-02 (×2): qty 1

## 2021-02-02 MED ORDER — OXYCODONE HCL 5 MG PO TABS
5.0000 mg | ORAL_TABLET | Freq: Once | ORAL | Status: AC
  Administered 2021-02-02: 5 mg via ORAL
  Filled 2021-02-02: qty 1

## 2021-02-02 MED ORDER — KETOROLAC TROMETHAMINE 15 MG/ML IJ SOLN
15.0000 mg | Freq: Four times a day (QID) | INTRAMUSCULAR | Status: AC
  Administered 2021-02-02 – 2021-02-07 (×20): 15 mg via INTRAVENOUS
  Filled 2021-02-02 (×20): qty 1

## 2021-02-02 MED ORDER — ENOXAPARIN SODIUM 40 MG/0.4ML IJ SOSY
40.0000 mg | PREFILLED_SYRINGE | INTRAMUSCULAR | Status: DC
  Administered 2021-02-06: 40 mg via SUBCUTANEOUS
  Filled 2021-02-02 (×6): qty 0.4

## 2021-02-02 MED ORDER — SODIUM CHLORIDE 0.9 % IV SOLN
25.0000 mg | INTRAVENOUS | Status: DC | PRN
  Filled 2021-02-02: qty 0.5

## 2021-02-02 MED ORDER — PROMETHAZINE HCL 25 MG RE SUPP: 12.5000 mg | RECTAL | Status: DC | PRN

## 2021-02-02 MED ORDER — FENTANYL CITRATE PF 50 MCG/ML IJ SOSY
PREFILLED_SYRINGE | INTRAMUSCULAR | Status: AC
  Administered 2021-02-02: 100 ug via INTRAVENOUS
  Filled 2021-02-02: qty 2

## 2021-02-02 MED ORDER — NALOXONE HCL 0.4 MG/ML IJ SOLN: 0.4000 mg | INTRAMUSCULAR | Status: DC | PRN

## 2021-02-02 MED ORDER — ACETAMINOPHEN 500 MG PO TABS
1000.0000 mg | ORAL_TABLET | Freq: Four times a day (QID) | ORAL | Status: DC | PRN
  Administered 2021-02-03 – 2021-02-07 (×6): 1000 mg via ORAL
  Filled 2021-02-02 (×6): qty 2

## 2021-02-02 MED ORDER — DIPHENHYDRAMINE HCL 25 MG PO CAPS: 25.0000 mg | ORAL_CAPSULE | ORAL | Status: DC | PRN

## 2021-02-02 MED ORDER — ACETAMINOPHEN 500 MG PO TABS
1000.0000 mg | ORAL_TABLET | Freq: Once | ORAL | Status: AC
  Administered 2021-02-02: 1000 mg via ORAL
  Filled 2021-02-02: qty 2

## 2021-02-02 MED ORDER — FAMOTIDINE IN NACL 20-0.9 MG/50ML-% IV SOLN
20.0000 mg | Freq: Two times a day (BID) | INTRAVENOUS | Status: DC
  Filled 2021-02-02 (×2): qty 50

## 2021-02-02 MED ORDER — SODIUM CHLORIDE 0.9% FLUSH: 9.0000 mL | INTRAVENOUS | Status: DC | PRN

## 2021-02-02 MED ORDER — PROMETHAZINE HCL 25 MG PO TABS
12.5000 mg | ORAL_TABLET | ORAL | Status: DC | PRN
  Administered 2021-02-03: 25 mg via ORAL
  Filled 2021-02-02: qty 1

## 2021-02-02 MED ORDER — POLYETHYLENE GLYCOL 3350 17 G PO PACK: 17.0000 g | PACK | Freq: Every day | ORAL | Status: DC | PRN

## 2021-02-02 MED ORDER — HYDROMORPHONE 1 MG/ML IV SOLN
INTRAVENOUS | Status: DC
  Administered 2021-02-02: 30 mg via INTRAVENOUS
  Administered 2021-02-03 (×2): 0.5 mg via INTRAVENOUS
  Administered 2021-02-03: 2 mg via INTRAVENOUS
  Filled 2021-02-02: qty 30

## 2021-02-02 NOTE — Progress Notes (Signed)
[facility] will assume care on arrival to accepting facility. Until arrival, care as per EDP. However, [facility] available 24/7 for questions and assistance.   Nursing staff please page [facility] Admits and Consults ([phone]) as soon as the patient arrives to the hospital.  Fritzi Scripter, DO  

## 2021-02-02 NOTE — ED Notes (Signed)
Pt reports decreased pain, pt appears sleepy, pt placed on 2L O2 via [place].

## 2021-02-02 NOTE — ED Notes (Signed)
CARELINK called spoke with Lizzie in reference to transfer to [facility] for Sickle Cell Crisis.

## 2021-02-02 NOTE — H&P (Signed)
History and Physical    Lyndon Chapel HBZ:[id number] DOB: 06-06-2000 DOA: 02/02/2021  PCP: Patient, No Pcp Per (Inactive)  Patient coming from: [facility] ED  I have personally briefly reviewed patient's old medical records in [facility] Link  Chief Complaint: Sickle cell pain  HPI: Andrew Kirk is a 20 y.o. male with medical history significant for sickle cell anemia who presented to the ED for evaluation of sickle cell pain crisis.  Patient reports acute low back pain beginning yesterday which is similar to his prior sickle cell pain crisis episodes.  Pain occasionally radiates to his chest.  He has had hot spells but denies any chills or diaphoresis.  He denies any shortness of breath or cough.  He did not have any injury.  States that he usually takes oxycodone for pain (No prescriptions filled since 06/25/2020-oxycodone 5 mg #4 per review of [place] controlled substance database) but has not had it as he has not been seen at the sickle cell clinic for a few months.  He tried ibuprofen without improvement and presented to [facility] ED for further management.  [facility] ED Course:  Initial vitals showed BP 120/71, pulse 63, RR 16, temp 97.5 F, SPO2 94% on room air.  Labs showed WBC 11.9, hemoglobin 8.5, platelets 291,000, reticulocytes 293, sodium 137, potassium 4.0, bicarb 24, BUN 11, creatinine 0.63, serum glucose 101, high-sensitivity troponin 23 > 15.  Influenza A is positive.  SARS-CoV-2 PCR is negative.  2 view chest x-ray is negative for focal consolidation, edema, or effusion.  Patient was given 2 L LR, IV Dilaudid 1 mg x 3, IV Toradol 15 mg x 1 without significant improvement in pain.  The hospitalist service was consulted to admit to [facility] for further evaluation and management.  While in the ED patient also received Dilaudid 2 mg x 2 and morphine 4 mg x 3.  Review of Systems: All systems reviewed and are negative except as documented in history of present illness above.   Past Medical  History:  Diagnosis Date   Sickle cell anemia (HCC)    Sickle cell anemia (HCC)     Past Surgical History:  Procedure Laterality Date   NO PAST SURGERIES      Social History:  reports that he has quit smoking. He has never used smokeless tobacco. He reports that he does not currently use alcohol. He reports current drug use. Drug: Marijuana.  No Known Allergies  Family History  Problem Relation Age of Onset   Healthy Mother    Heart attack Father      Prior to Admission medications   Medication Sig Start Date End Date Taking? Authorizing Provider  folic acid (FOLVITE) 1 MG tablet Take 1 mg by mouth daily. Patient not taking: No sig reported 01/07/18   [provider]  HYDROcodone-acetaminophen (NORCO/VICODIN) 5-325 MG tablet 1-2 tabs po bid prn Patient not taking: No sig reported 07/04/18   Payton Mccallum, MD  HYDROXYUREA PO Take by mouth. Patient not taking: No sig reported    [provider]  ibuprofen (ADVIL) 200 MG tablet Take 200 mg by mouth every 6 (six) hours as needed.    [provider]  meloxicam (MOBIC) 15 MG tablet Take 1 tablet (15 mg total) by mouth daily as needed for pain. Patient not taking: No sig reported 04/23/18   Tommie Sams, DO    Physical Exam: Vitals:   02/02/21 1807  BP: (!) 115/53  Resp: 18  Temp: 97.7 F (  36.5 C)  TempSrc: Axillary  SpO2: 96%   Constitutional: Sitting in bed, appears comfortable, limited cooperation Eyes: PERRL, lids and conjunctivae normal ENMT: Mucous membranes are dry. Posterior pharynx clear of any exudate or lesions.Normal dentition.  Neck: normal, supple, no masses. Respiratory: clear to auscultation bilaterally, no wheezing, no crackles. Normal respiratory effort. No accessory muscle use.  Cardiovascular: Regular rate and rhythm, systolic murmur present. No extremity edema. 2+ pedal pulses. Abdomen: no tenderness, no masses palpated. No hepatosplenomegaly. Bowel sounds positive.   Musculoskeletal: no clubbing / cyanosis. No joint deformity upper and lower extremities. Good ROM, no contractures. Normal muscle tone.  Skin: no rashes, lesions, ulcers. No induration Neurologic: CN 2-12 grossly intact. Sensation intact. Strength 5/5 in all 4.  Psychiatric: Alert and oriented x 3.  Labs on Admission: I have personally reviewed following labs and imaging studies  CBC: Recent Labs  Lab 02/02/21 0320  WBC 11.9*  NEUTROABS 4.7  HGB 8.5*  HCT 23.8*  MCV 86.5  PLT 291   Basic Metabolic Panel: Recent Labs  Lab 02/02/21 0320  NA 137  K 4.0  CL 108  CO2 24  GLUCOSE 101*  BUN 11  CREATININE 0.63  CALCIUM 8.9   GFR: Estimated Creatinine Clearance: 140.1 mL/min (by C-G formula based on SCr of 0.63 mg/dL). Liver Function Tests: No results for input(s): AST, ALT, ALKPHOS, BILITOT, PROT, ALBUMIN in the last 168 hours. No results for input(s): LIPASE, AMYLASE in the last 168 hours. No results for input(s): AMMONIA in the last 168 hours. Coagulation Profile: No results for input(s): INR, PROTIME in the last 168 hours. Cardiac Enzymes: No results for input(s): CKTOTAL, CKMB, CKMBINDEX, TROPONINI in the last 168 hours. BNP (last 3 results) No results for input(s): PROBNP in the last 8760 hours. HbA1C: No results for input(s): HGBA1C in the last 72 hours. CBG: No results for input(s): GLUCAP in the last 168 hours. Lipid Profile: No results for input(s): CHOL, HDL, LDLCALC, TRIG, CHOLHDL, LDLDIRECT in the last 72 hours. Thyroid Function Tests: No results for input(s): TSH, T4TOTAL, FREET4, T3FREE, THYROIDAB in the last 72 hours. Anemia Panel: Recent Labs    02/02/21 0320  RETICCTPCT 10.5*   Urine analysis:    Component Value Date/Time   COLORURINE Straw 03/21/2013 0759   APPEARANCEUR Clear 03/21/2013 0759   LABSPEC 1.010 03/21/2013 0759   PHURINE 7.0 03/21/2013 0759   GLUCOSEU Negative 03/21/2013 0759   HGBUR Negative 03/21/2013 0759   BILIRUBINUR  Negative 03/21/2013 0759   KETONESUR Negative 03/21/2013 0759   PROTEINUR Negative 03/21/2013 0759   NITRITE Negative 03/21/2013 0759   LEUKOCYTESUR Negative 03/21/2013 0759    Radiological Exams on Admission: DG Chest 2 View  Result Date: 02/02/2021 CLINICAL DATA:  Sickle cell pain. EXAM: CHEST - 2 VIEW COMPARISON:  Chest radiograph dated 02/15/2016. FINDINGS: The heart size and mediastinal contours are within normal limits. Both lungs are clear. The visualized skeletal structures are unremarkable. IMPRESSION: No active cardiopulmonary disease. Electronically Signed   By: Elgie Collard M.D.   On: 02/02/2021 03:01    EKG: Personally reviewed. Normal sinus rhythm without acute ischemic changes.  Assessment/Plan Principal Problem:   Sickle cell pain crisis (HCC) Active Problems:   Influenza A   Andrew Kirk is a 20 y.o. male with medical history significant for sickle cell anemia who is admitted with sickle cell pain crisis.  Sickle cell anemia with acute pain crisis: Uncontrolled pain despite multiple doses of IV Dilaudid and morphine while in the ED.  Follows with [facility] hematology, last seen February 2022. -Start on Dilaudid PCA -IV Toradol 15 mg every 6 hours -0.45% NS@125  mL/hour overnight -Hemoglobin 8.5, currently no indication for transfusion  Influenza A: Influenza A positive 11/10, potentially triggering pain crisis.  Start on Tamiflu.  DVT prophylaxis: Lovenox Code Status: Full code Family Communication: Discussed with patient's significant other at bedside Disposition Plan: From home, dispo pending clinical progress Consults called: None Level of care: Med-Surg Admission status:  Status is: Observation  The patient remains OBS appropriate and will d/c before 2 midnights.  Darreld Mclean MD [facility]  If 7PM-7AM, please contact night-coverage [email or web address]  02/02/2021, 7:09 PM

## 2021-02-02 NOTE — ED Provider Notes (Signed)
[facility] Emergency Department Provider Note ____________________________________________   Event Date/Time   First MD Initiated Contact with Patient 02/02/21 [phone]     (approximate)  I have reviewed the triage vital signs and the nursing notes.  HISTORY  Chief Complaint Sickle Cell Pain Crisis   HPI Andrew Kirk is a 20 y.o. malewho presents to the ED for evaluation of back pain.   Chart review indicates SCD.   Pt presents to the ED for evaluation of a couple hours of acute atraumatic back pain, radiating through his substernal chest.  Reports it feels like his muscles are spasming.  Reports 10/10 pain.  Took 200 mg ibuprofen prior to arrival without improvement. Reports he used to have a prescription for hydrocodone at home, but he ran out and has not been to the pain or sickle cell clinic for a few months.  Past Medical History:  Diagnosis Date   Sickle cell anemia (HCC)    Sickle cell anemia (HCC)     Patient Active Problem List   Diagnosis Date Noted   Sickle cell pain crisis (HCC) 02/02/2021     Past Surgical History:  Procedure Laterality Date   NO PAST SURGERIES      Prior to Admission medications   Medication Sig Start Date End Date Taking? Authorizing Provider  folic acid (FOLVITE) 1 MG tablet Take 1 mg by mouth daily. 01/07/18   [provider]  HYDROcodone-acetaminophen (NORCO/VICODIN) 5-325 MG tablet 1-2 tabs po bid prn 07/04/18   Payton Mccallum, MD  HYDROXYUREA PO Take by mouth.    [provider]  meloxicam (MOBIC) 15 MG tablet Take 1 tablet (15 mg total) by mouth daily as needed for pain. 04/23/18   Tommie Sams, DO    Allergies Patient has no known allergies.  Family History  Problem Relation Age of Onset   Healthy Mother    Heart attack Father     Social History Social History   Tobacco Use   Smoking status: Former   Smokeless tobacco: Never  Building services engineer Use: Never used  Substance Use  Topics   Alcohol use: Not Currently   Drug use: Yes    Types: Marijuana   Review of Systems  Constitutional: No fever/chills Eyes: No visual changes. ENT: No sore throat. Cardiovascular: Positive for chest pain. Respiratory: Denies shortness of breath. Gastrointestinal: No abdominal pain.  No nausea, no vomiting.  No diarrhea.  No constipation. Genitourinary: Negative for dysuria. Musculoskeletal: Positive for back pain. Skin: Negative for rash. Neurological: Negative for headaches, focal weakness or numbness. ____________________________________________   PHYSICAL EXAM:  VITAL SIGNS: Vitals:   02/02/21 0530 02/02/21 0600  BP: (!) 155/75 131/78  Pulse: 69 61  Resp: 18 20  Temp:    SpO2: 94% 92%    Constitutional: Alert and oriented. Obviously uncomfortable. Diaphoretic, unable to sit still.  Eyes: Conjunctivae are normal. PERRL. EOMI. Head: Atraumatic. Nose: No congestion/rhinnorhea. Mouth/Throat: Mucous membranes are moist.  Oropharynx non-erythematous. Neck: No stridor. No cervical spine tenderness to palpation. Cardiovascular: Normal rate, regular rhythm. Grossly normal heart sounds.  Good peripheral circulation. Respiratory: Normal respiratory effort.  No retractions. Lungs CTAB. Gastrointestinal: Soft , nondistended, nontender to palpation. No CVA tenderness. Musculoskeletal: No lower extremity tenderness nor edema.  No joint effusions. No signs of acute trauma. No signs of trauma, skin changes or deformity to the back at his site of pain.  He is tender. Neurologic:  Normal speech and language. No gross  focal neurologic deficits are appreciated. No gait instability noted. Skin:  Skin is warm, moist and intact. No rash noted. Psychiatric: Mood and affect are normal. Speech and behavior are normal.  ____________________________________________   LABS (all labs ordered are listed, but only abnormal results are displayed)  Labs Reviewed  CBC WITH  DIFFERENTIAL/PLATELET - Abnormal; Notable for the following components:      Result Value   WBC 11.9 (*)    RBC 2.75 (*)    Hemoglobin 8.5 (*)    HCT 23.8 (*)    RDW 21.2 (*)    nRBC 3.8 (*)    Lymphs Abs 4.8 (*)    Monocytes Absolute 1.5 (*)    Abs Immature Granulocytes 0.45 (*)    All other components within normal limits  RETICULOCYTES - Abnormal; Notable for the following components:   Retic Ct Pct 10.5 (*)    RBC. 2.79 (*)    Retic Count, Absolute 293.0 (*)    Immature Retic Fract 40.9 (*)    All other components within normal limits  BASIC METABOLIC PANEL - Abnormal; Notable for the following components:   Glucose, Bld 101 (*)    All other components within normal limits  TROPONIN I (HIGH SENSITIVITY) - Abnormal; Notable for the following components:   Troponin I (High Sensitivity) 23 (*)    All other components within normal limits  TROPONIN I (HIGH SENSITIVITY)   ____________________________________________  12 Lead EKG   ____________________________________________  RADIOLOGY  ED MD interpretation:  CXR reviewed by me without evidence of acute cardiopulmonary pathology.  Official radiology report(s): DG Chest 2 View  Result Date: 02/02/2021 CLINICAL DATA:  Sickle cell pain. EXAM: CHEST - 2 VIEW COMPARISON:  Chest radiograph dated 02/15/2016. FINDINGS: The heart size and mediastinal contours are within normal limits. Both lungs are clear. The visualized skeletal structures are unremarkable. IMPRESSION: No active cardiopulmonary disease. Electronically Signed   By: Elgie Collard M.D.   On: 02/02/2021 03:01    ____________________________________________   PROCEDURES and INTERVENTIONS  Procedure(s) performed (including Critical Care):  .1-3 Lead EKG Interpretation Performed by: Delton Prairie, MD Authorized by: Delton Prairie, MD     Interpretation: normal     ECG rate:  70   ECG rate assessment: normal     Rhythm: sinus rhythm     Ectopy: none      Conduction: normal    Medications  lidocaine (LIDODERM) 5 % 1 patch (1 patch Transdermal Patch Applied 02/02/21 0530)  ketorolac (TORADOL) 30 MG/ML injection 15 mg (15 mg Intravenous Given 02/02/21 0315)  HYDROmorphone (DILAUDID) injection 1 mg (1 mg Subcutaneous Given 02/02/21 0245)  lactated ringers bolus 1,000 mL (0 mLs Intravenous Stopped 02/02/21 0431)  acetaminophen (TYLENOL) tablet 1,000 mg (1,000 mg Oral Given 02/02/21 0355)  oxyCODONE (Oxy IR/ROXICODONE) immediate release tablet 5 mg (5 mg Oral Given 02/02/21 0355)  HYDROmorphone (DILAUDID) injection 1 mg (1 mg Subcutaneous Given 02/02/21 0355)  lactated ringers bolus 1,000 mL (0 mLs Intravenous Stopped 02/02/21 0556)  HYDROmorphone (DILAUDID) injection 1 mg (1 mg Subcutaneous Given 02/02/21 0530)  methocarbamol (ROBAXIN) tablet 500 mg (500 mg Oral Given 02/02/21 0530)  fentaNYL (SUBLIMAZE) injection 100 mcg (100 mcg Intravenous Given 02/02/21 0616)    ____________________________________________   MDM / ED COURSE   20 year old male presents to the ED with sickle cell pain crisis requiring transfer to facilitate appropriate admission for pain control.  He is uncomfortable-appearing, diaphoretic and cannot sit still due to pain.  While he has a  couple brief moments of controlled pain, he repeatedly devolves into poorly controlled pain requiring repeated doses of analgesics.  No evidence of aplastic crisis, acute chest syndrome or ACS.  No evidence of complicating features beyond poorly controlled sickle cell pain crisis.  We will have him transferred to [facility] to facilitate admission and appropriate care.  Clinical Course as of 02/02/21 0620  Thu Feb 02, 2021  0520 Reassessed.  Did have improved pain before, but just prior to my reevaluation he had spasm of worsening pain.  We will medicate again.  We discussed if he still has poorly controlled pain after this we would probably have to admit him, possibly transfer to [facility].  He expresses understanding and agreement. [DS]  3159 Poorly controlled pain. We'll reach out to [facility] for transfer [DS]  [phone] I discuss with Dr. Imogene Burn at [facility], accepts for transfer [DS]    Clinical Course User Index [DS] Delton Prairie, MD    ____________________________________________   FINAL CLINICAL IMPRESSION(S) / ED DIAGNOSES  Final diagnoses:  Sickle cell pain crisis [facility])  Other chest pain     ED Discharge Orders     None        Shelly Shoultz Katrinka Blazing   Note:  This document was prepared using Dragon voice recognition software and may include unintentional dictation errors.    Delton Prairie, MD 02/02/21 [phone]

## 2021-02-02 NOTE — ED Notes (Signed)
Pt in bed, pt has no requests at this time, states that he doesn't need anything for pain at this time.

## 2021-02-02 NOTE — ED Notes (Signed)
Warm pack applied to pt back where he is having pain. Pt is moaning and crying due to the pain. Pt sts " my pain comes in waves. It will dye down and than come back really strong."

## 2021-02-02 NOTE — ED Notes (Signed)
Called to check on status,  Phoebe Sharps said she would check  1230

## 2021-02-02 NOTE — ED Notes (Signed)
Pt to be transferred to [facility], awaiting bed assignment

## 2021-02-02 NOTE — Progress Notes (Signed)
Pt states he does not feel like answering any more questions on the nursing history. Pt's rn informed. Briscoe Burns BSN, RN-BC Admissions RN 02/02/2021 6:54 PM

## 2021-02-02 NOTE — ED Notes (Signed)
EMTALA reviewed by charge RN, Penni Bombard.  Med Necessity completed, printed and given to unit secretary for paperwork.

## 2021-02-02 NOTE — ED Triage Notes (Signed)
Pt to ED from home c/o sickle cell crisis that flared up tonight.  Pain mainly through back and chest tonight.  States took motrin at home without relief.  Pt unable to sit still in triage room but answering most questions, taken back to treatment room.

## 2021-02-03 DIAGNOSIS — J101 Influenza due to other identified influenza virus with other respiratory manifestations: Secondary | ICD-10-CM | POA: Diagnosis not present

## 2021-02-03 DIAGNOSIS — G894 Chronic pain syndrome: Secondary | ICD-10-CM | POA: Diagnosis not present

## 2021-02-03 DIAGNOSIS — Z8249 Family history of ischemic heart disease and other diseases of the circulatory system: Secondary | ICD-10-CM | POA: Diagnosis not present

## 2021-02-03 DIAGNOSIS — Z5329 Procedure and treatment not carried out because of patient's decision for other reasons: Secondary | ICD-10-CM | POA: Diagnosis not present

## 2021-02-03 DIAGNOSIS — Z87891 Personal history of nicotine dependence: Secondary | ICD-10-CM | POA: Diagnosis not present

## 2021-02-03 DIAGNOSIS — D5700 Hb-SS disease with crisis, unspecified: Secondary | ICD-10-CM | POA: Diagnosis not present

## 2021-02-03 DIAGNOSIS — J90 Pleural effusion, not elsewhere classified: Secondary | ICD-10-CM | POA: Diagnosis not present

## 2021-02-03 DIAGNOSIS — R042 Hemoptysis: Secondary | ICD-10-CM | POA: Diagnosis not present

## 2021-02-03 LAB — CBC WITH DIFFERENTIAL/PLATELET
Abs Immature Granulocytes: 0.16 10*3/uL — ABNORMAL HIGH (ref 0.00–0.07)
Basophils Absolute: 0 10*3/uL (ref 0.0–0.1)
Basophils Relative: 0 %
Eosinophils Absolute: 0.1 10*3/uL (ref 0.0–0.5)
Eosinophils Relative: 1 %
HCT: 22.3 % — ABNORMAL LOW (ref 39.0–52.0)
Hemoglobin: 8.2 g/dL — ABNORMAL LOW (ref 13.0–17.0)
Immature Granulocytes: 1 %
Lymphocytes Relative: 15 %
Lymphs Abs: 2 10*3/uL (ref 0.7–4.0)
MCH: 30.5 pg (ref 26.0–34.0)
MCHC: 36.8 g/dL — ABNORMAL HIGH (ref 30.0–36.0)
MCV: 82.9 fL (ref 80.0–100.0)
Monocytes Absolute: 1.7 10*3/uL — ABNORMAL HIGH (ref 0.1–1.0)
Monocytes Relative: 13 %
Neutro Abs: 9.7 10*3/uL — ABNORMAL HIGH (ref 1.7–7.7)
Neutrophils Relative %: 70 %
Platelets: 282 10*3/uL (ref 150–400)
RBC: 2.69 MIL/uL — ABNORMAL LOW (ref 4.22–5.81)
RDW: 22 % — ABNORMAL HIGH (ref 11.5–15.5)
WBC: 13.7 10*3/uL — ABNORMAL HIGH (ref 4.0–10.5)
nRBC: 7.8 % — ABNORMAL HIGH (ref 0.0–0.2)

## 2021-02-03 LAB — BASIC METABOLIC PANEL
Anion gap: 10 (ref 5–15)
BUN: 7 mg/dL (ref 6–20)
CO2: 23 mmol/L (ref 22–32)
Calcium: 9.1 mg/dL (ref 8.9–10.3)
Chloride: 103 mmol/L (ref 98–111)
Creatinine, Ser: 0.51 mg/dL — ABNORMAL LOW (ref 0.61–1.24)
GFR, Estimated: >60 mL/min (ref >60)
Glucose, Bld: 91 mg/dL (ref 70–99)
Potassium: 3.9 mmol/L (ref 3.5–5.1)
Sodium: 136 mmol/L (ref 135–145)

## 2021-02-03 LAB — HIV ANTIBODY (ROUTINE TESTING W REFLEX): HIV Screen 4th Generation wRfx: NONREACTIVE

## 2021-02-03 MED ORDER — HYDROMORPHONE 1 MG/ML IV SOLN
INTRAVENOUS | Status: DC
  Administered 2021-02-03: 3 mg via INTRAVENOUS
  Administered 2021-02-03: 2 mg via INTRAVENOUS
  Administered 2021-02-04: 1.5 mg via INTRAVENOUS
  Administered 2021-02-04: 0 mg via INTRAVENOUS
  Administered 2021-02-04 – 2021-02-05 (×3): 1 mg via INTRAVENOUS
  Administered 2021-02-05 (×2): 2 mg via INTRAVENOUS
  Administered 2021-02-05: 1.29 mg via INTRAVENOUS
  Administered 2021-02-05: 2 mg via INTRAVENOUS
  Administered 2021-02-06: 30 mg via INTRAVENOUS
  Administered 2021-02-06: 1.2 mg via INTRAVENOUS
  Administered 2021-02-06 (×2): 1.5 mg via INTRAVENOUS
  Administered 2021-02-06: 2 mg via INTRAVENOUS
  Administered 2021-02-06: 1.2 mg via INTRAVENOUS
  Administered 2021-02-06: 0 mg via INTRAVENOUS
  Administered 2021-02-07: 1.5 mg via INTRAVENOUS
  Administered 2021-02-07: 3.5 mg via INTRAVENOUS
  Filled 2021-02-03: qty 30

## 2021-02-03 MED ORDER — HYDROMORPHONE HCL 1 MG/ML IJ SOLN
1.0000 mg | Freq: Once | INTRAMUSCULAR | Status: AC
  Administered 2021-02-03: 1 mg via INTRAVENOUS
  Filled 2021-02-03: qty 1

## 2021-02-03 NOTE — Progress Notes (Signed)
Subjective: Andrew Kirk is a a 20 year old male with a medical history significant for sickle cell disease that was admitted for influenza A in the setting of sickle cell pain crisis.  Patient reports allover body pain, primarily to low back.  He says the pain intensity is 9/10 constant, throbbing, and occasionally sharp.  He denies any headache, shortness of breath, urinary symptoms, nausea, vomiting, or diarrhea.  Patient continues to endorse some chest pain.  Current oxygen saturation is 96% on RA.  Objective:  Vital signs in last 24 hours:  Vitals:   02/03/21 0823 02/03/21 1147 02/03/21 1402 02/03/21 1620  BP:   121/65   Pulse:   84   Resp: 18 18 19 17   Temp:   99 F (37.2 C)   TempSrc:   Oral   SpO2: 96%  95%     Intake/Output from previous day:   Intake/Output Summary (Last 24 hours) at 02/03/2021 1717 Last data filed at 02/03/2021 1656 Gross per 24 hour  Intake 1352.74 ml  Output 2500 ml  Net -1147.26 ml    Physical Exam: General: Alert, awake, oriented x3, in no acute distress.  HEENT: [place]/AT PEERL, EOMI Neck: Trachea midline,  no masses, no thyromegal,y no JVD, no carotid bruit OROPHARYNX:  Moist, No exudate/ erythema/lesions.  Heart: Regular rate and rhythm, without murmurs, rubs, gallops, PMI non-displaced, no heaves or thrills on palpation.  Lungs: Clear to auscultation, no wheezing or rhonchi noted. No increased vocal fremitus resonant to percussion  Abdomen: Soft, nontender, nondistended, positive bowel sounds, no masses no hepatosplenomegaly noted..  Neuro: No focal neurological deficits noted cranial nerves II through XII grossly intact. DTRs 2+ bilaterally upper and lower extremities. Strength 5 out of 5 in bilateral upper and lower extremities. Musculoskeletal: No warm swelling or erythema around joints, no spinal tenderness noted. Psychiatric: Patient alert and oriented x3, good insight and cognition, good recent to remote recall. Lymph node survey: No  cervical axillary or inguinal lymphadenopathy noted.  Lab Results:  Basic Metabolic Panel:    Component Value Date/Time   NA 136 02/03/2021 0508   NA 138 03/21/2013 0705   K 3.9 02/03/2021 0508   K 4.4 03/21/2013 0705   CL 103 02/03/2021 0508   CL 108 (H) 03/21/2013 0705   CO2 23 02/03/2021 0508   CO2 25 03/21/2013 0705   BUN 7 02/03/2021 0508   BUN 7 (L) 03/21/2013 0705   CREATININE 0.51 (L) 02/03/2021 0508   CREATININE 0.52 (L) 03/21/2013 0705   GLUCOSE 91 02/03/2021 0508   GLUCOSE 92 03/21/2013 0705   CALCIUM 9.1 02/03/2021 0508   CALCIUM 9.3 03/21/2013 0705   CBC:    Component Value Date/Time   WBC 13.7 (H) 02/03/2021 0508   HGB 8.2 (L) 02/03/2021 0508   HGB 8.8 (L) 03/21/2013 0705   HCT 22.3 (L) 02/03/2021 0508   HCT 25.9 (L) 03/21/2013 0705   PLT 282 02/03/2021 0508   PLT 462 (H) 03/21/2013 0705   MCV 82.9 02/03/2021 0508   MCV 84 03/21/2013 0705   NEUTROABS 9.7 (H) 02/03/2021 0508   NEUTROABS 6.7 (H) 04/04/2011 1148   LYMPHSABS 2.0 02/03/2021 0508   LYMPHSABS 1.5 04/04/2011 1148   MONOABS 1.7 (H) 02/03/2021 0508   MONOABS 1.0 (H) 04/04/2011 1148   EOSABS 0.1 02/03/2021 0508   EOSABS 0.3 04/04/2011 1148   BASOSABS 0.0 02/03/2021 0508   BASOSABS 0.1 03/21/2013 0705   BASOSABS 2 03/21/2013 0705    Recent Results (from the past 240  hour(s))  Resp Panel by RT-PCR (Flu A&B, Covid) Nasopharyngeal Swab     Status: Abnormal   Collection Time: 02/02/21  7:49 AM   Specimen: Nasopharyngeal Swab; Nasopharyngeal(NP) swabs in vial transport medium  Result Value Ref Range Status   SARS Coronavirus 2 by RT PCR NEGATIVE NEGATIVE Final    Comment: (NOTE) SARS-CoV-2 target nucleic acids are NOT DETECTED.  The SARS-CoV-2 RNA is generally detectable in upper respiratory specimens during the acute phase of infection. The lowest concentration of SARS-CoV-2 viral copies this assay can detect is 138 copies/mL. A negative result does not preclude SARS-Cov-2 infection and  should not be used as the sole basis for treatment or other patient management decisions. A negative result may occur with  improper specimen collection/handling, submission of specimen other than nasopharyngeal swab, presence of viral mutation(s) within the areas targeted by this assay, and inadequate number of viral copies(<138 copies/mL). A negative result must be combined with clinical observations, patient history, and epidemiological information. The expected result is Negative.  Fact Sheet for Patients:  BloggerCourse.com  Fact Sheet for Healthcare Providers:  SeriousBroker.it  This test is no t yet approved or cleared by the [place] FDA and  has been authorized for detection and/or diagnosis of SARS-CoV-2 by FDA under an Emergency Use Authorization (EUA). This EUA will remain  in effect (meaning this test can be used) for the duration of the COVID-19 declaration under Section 564(b)(1) of the Act, 21 U.S.C.section 360bbb-3(b)(1), unless the authorization is terminated  or revoked sooner.       Influenza A by PCR POSITIVE (A) NEGATIVE Final   Influenza B by PCR NEGATIVE NEGATIVE Final    Comment: (NOTE) The Xpert Xpress SARS-CoV-2/FLU/RSV plus assay is intended as an aid in the diagnosis of influenza from Nasopharyngeal swab specimens and should not be used as a sole basis for treatment. Nasal washings and aspirates are unacceptable for Xpert Xpress SARS-CoV-2/FLU/RSV testing.  Fact Sheet for Patients: BloggerCourse.com  Fact Sheet for Healthcare Providers: SeriousBroker.it  This test is not yet approved or cleared by the [place] FDA and has been authorized for detection and/or diagnosis of SARS-CoV-2 by FDA under an Emergency Use Authorization (EUA). This EUA will remain in effect (meaning this test can be used) for the duration of the COVID-19 declaration  under Section 564(b)(1) of the Act, 21 U.S.C. section 360bbb-3(b)(1), unless the authorization is terminated or revoked.  Performed at [facility], [address], [place], [place] [address]     Studies/Results: [place] Chest 2 View  Result Date: 02/02/2021 CLINICAL DATA:  Sickle cell pain. EXAM: CHEST - 2 VIEW COMPARISON:  Chest radiograph dated 02/15/2016. FINDINGS: The heart size and mediastinal contours are within normal limits. Both lungs are clear. The visualized skeletal structures are unremarkable. IMPRESSION: No active cardiopulmonary disease. Electronically Signed   By: Elgie Collard M.D.   On: 02/02/2021 03:01    Medications: Scheduled Meds:  enoxaparin (LOVENOX) injection  40 mg Subcutaneous Q24H   famotidine  20 mg Oral Q12H   HYDROmorphone   Intravenous Q4H   ketorolac  15 mg Intravenous Q6H   oseltamivir  75 mg Oral BID   senna-docusate  1 tablet Oral BID   Continuous Infusions:  diphenhydrAMINE     famotidine (PEPCID) IV     PRN Meds:.acetaminophen, diphenhydrAMINE **OR** diphenhydrAMINE, naloxone **AND** sodium chloride flush, polyethylene glycol, promethazine **OR** promethazine  Consultants: None  Procedures: None  Antibiotics: None  Assessment/Plan: Principal Problem:   Sickle cell  pain crisis (HCC) Active Problems:   Influenza A  Sickle cell disease with pain crisis: Continue IV fluids IV Toradol 15 mg every 6 hours Increase settings of IV Dilaudid PCA for greater pain control Oxycodone 10 mg every 4 hours as needed for severe breakthrough pain Monitor vital signs very closely, reevaluate pain scale regularly, and supplemental oxygen as needed  Influenza A: Influenza A positive.  Continue Tamiflu 75 mg twice daily.  Also, continue supportive care.  Supplemental oxygen as needed.  Anemia of chronic disease: Patient's hemoglobin is 8.2, which is consistent with his baseline.  There is no clinical indication for blood transfusion at  this time.  Continue to follow closely.  Leukocytosis: WBCs 13.7.  Patient is afebrile.  Influenza positive.  Supportive care.  Follow labs in AM.  Code Status: Full Code Family Communication: N/A Disposition Plan: Not yet ready for discharge  Nolon Nations  APRN, MSN, FNP-C [facility] [address] [place], [place] [address] [phone]  If 7PM-7AM, please contact night-coverage.  02/03/2021, 5:17 PM  LOS: 1 day

## 2021-02-03 NOTE — Progress Notes (Signed)
Pt states pain 9/10 in back. Pain medicine delivered via PCA.  MD informed for PRN pain medication.  Pt voiced he wants to leave because he feels he is not getting sufficient pain regimen here, states " I'll go to [facility]". Pt is tearful.   This RN provided therapeutic communication and provided tylenol PRN.

## 2021-02-03 NOTE — Progress Notes (Signed)
Heatiing pad applied while waiting for K pad to be delivered.

## 2021-02-03 NOTE — Progress Notes (Signed)
   02/03/21 2229  Assess: MEWS Score  Temp (!) 102.5 F (39.2 C)  BP (!) 134/58  Pulse Rate 92  Resp 17  SpO2 91 %  O2 Device Nasal Cannula  Assess: MEWS Score  MEWS Temp 2  MEWS Systolic 0  MEWS Pulse 0  MEWS RR 0  MEWS LOC 0  MEWS Score 2  MEWS Score Color Yellow  Assess: if the MEWS score is Yellow or Red  Were vital signs taken at a resting state? Yes  Focused Assessment No change from prior assessment  Does the patient meet 2 or more of the SIRS criteria? Yes  Does the patient have a confirmed or suspected source of infection? Yes (pt is positive for flu at admission)  Provider and Rapid Response Notified? No  MEWS guidelines implemented *See Row Information* Yes  Treat  MEWS Interventions Administered prn meds/treatments (tylenol  given will recheck in one hour time)  Pain Scale 0-10  Pain Score 7  Pain Type Acute pain  Pain Location Back  Pain Orientation Mid  Pain Descriptors / Indicators Aching  Pain Frequency Constant  Pain Onset On-going  Patients Stated Pain Goal 0  Pain Intervention(s) Medication (See eMAR) (pt on PCA pump)  Multiple Pain Sites No  Patients response to intervention Effective  Take Vital Signs  Increase Vital Sign Frequency  Yellow: Q 2hr X 2 then Q 4hr X 2, if remains yellow, continue Q 4hrs  Escalate  MEWS: Escalate Yellow: discuss with charge nurse/RN and consider discussing with provider and RRT  Notify: Charge Nurse/RN  Name of Charge Nurse/RN Notified Keiva RN  Date Charge Nurse/RN Notified 02/03/21  Time Charge Nurse/RN Notified 2254  Document  Patient Outcome Stabilized after interventions  Progress note created (see row info) Yes  Assess: SIRS CRITERIA  SIRS Temperature  1  SIRS Pulse 1  SIRS Respirations  0  SIRS WBC 0  SIRS Score Sum  2

## 2021-02-03 NOTE — Progress Notes (Addendum)
Pt expressed feelings of distress and being irritated d/t family issues. This RN provided therapeutic communication and reassurance.  Pt seemed to feel a sense of better comfort. K pad applied to assist with discomfort in back pain.  Pt also informed this RN that he had coughed up a little bit of red tinged sputum. NO active coughing noted at this time. No lesions on oral assessment.  MD notified and aware.  Will continue to monitor.

## 2021-02-04 DIAGNOSIS — D5700 Hb-SS disease with crisis, unspecified: Secondary | ICD-10-CM | POA: Diagnosis not present

## 2021-02-04 DIAGNOSIS — J101 Influenza due to other identified influenza virus with other respiratory manifestations: Secondary | ICD-10-CM | POA: Diagnosis not present

## 2021-02-04 MED ORDER — HYDROMORPHONE HCL 2 MG/ML IJ SOLN
2.0000 mg | Freq: Once | INTRAMUSCULAR | Status: AC
  Administered 2021-02-04: 2 mg via INTRAVENOUS
  Filled 2021-02-04: qty 1

## 2021-02-04 NOTE — Progress Notes (Signed)
Patient ID: Andrew Kirk, male   DOB: 2001/02/06, 20 y.o.   MRN: [id number] Subjective: Andrew Kirk is a a 20 year old male with a medical history significant for sickle cell disease that was admitted for influenza A in the setting of sickle cell pain crisis.  Patient was writhing in pain this morning, complaining of significant pain in his lower back, rated at 10/10, constant, throbbing and achy.  He is also coughing with occasional bloodstained sputum.  No chest pain or shortness of breath.  He denies any fever, nausea, vomiting or diarrhea.  Objective:  Vital signs in last 24 hours:  Vitals:   02/04/21 0230 02/04/21 0630 02/04/21 0851 02/04/21 1115  BP: (!) 120/49 116/63    Pulse: 66 79    Resp: 18 18 18 17   Temp: 98.8 F (37.1 C) 98.8 F (37.1 C)    TempSrc: Oral Oral    SpO2: 90% 98% 97% 98%    Intake/Output from previous day:   Intake/Output Summary (Last 24 hours) at 02/04/2021 1230 Last data filed at 02/04/2021 1227 Gross per 24 hour  Intake 300 ml  Output 1100 ml  Net -800 ml    Physical Exam: General: Alert, awake, oriented x3, in some pain distress.  HEENT: [place]/AT PEERL, EOMI Neck: Trachea midline,  no masses, no thyromegal,y no JVD, no carotid bruit OROPHARYNX:  Moist, No exudate/ erythema/lesions.  Heart: Regular rate and rhythm, without murmurs, rubs, gallops, PMI non-displaced, no heaves or thrills on palpation.  Lungs: Clear to auscultation, no wheezing or rhonchi noted. No increased vocal fremitus resonant to percussion  Abdomen: Soft, nontender, nondistended, positive bowel sounds, no masses no hepatosplenomegaly noted..  Neuro: No focal neurological deficits noted cranial nerves II through XII grossly intact. DTRs 2+ bilaterally upper and lower extremities. Strength 5 out of 5 in bilateral upper and lower extremities. Musculoskeletal: No warm swelling or erythema around joints, no spinal tenderness noted. Psychiatric: Patient alert and oriented x3, good insight and  cognition, good recent to remote recall. Lymph node survey: No cervical axillary or inguinal lymphadenopathy noted.  Lab Results:  Basic Metabolic Panel:    Component Value Date/Time   NA 136 02/03/2021 0508   NA 138 03/21/2013 0705   K 3.9 02/03/2021 0508   K 4.4 03/21/2013 0705   CL 103 02/03/2021 0508   CL 108 (H) 03/21/2013 0705   CO2 23 02/03/2021 0508   CO2 25 03/21/2013 0705   BUN 7 02/03/2021 0508   BUN 7 (L) 03/21/2013 0705   CREATININE 0.51 (L) 02/03/2021 0508   CREATININE 0.52 (L) 03/21/2013 0705   GLUCOSE 91 02/03/2021 0508   GLUCOSE 92 03/21/2013 0705   CALCIUM 9.1 02/03/2021 0508   CALCIUM 9.3 03/21/2013 0705   CBC:    Component Value Date/Time   WBC 13.7 (H) 02/03/2021 0508   HGB 8.2 (L) 02/03/2021 0508   HGB 8.8 (L) 03/21/2013 0705   HCT 22.3 (L) 02/03/2021 0508   HCT 25.9 (L) 03/21/2013 0705   PLT 282 02/03/2021 0508   PLT 462 (H) 03/21/2013 0705   MCV 82.9 02/03/2021 0508   MCV 84 03/21/2013 0705   NEUTROABS 9.7 (H) 02/03/2021 0508   NEUTROABS 6.7 (H) 04/04/2011 1148   LYMPHSABS 2.0 02/03/2021 0508   LYMPHSABS 1.5 04/04/2011 1148   MONOABS 1.7 (H) 02/03/2021 0508   MONOABS 1.0 (H) 04/04/2011 1148   EOSABS 0.1 02/03/2021 0508   EOSABS 0.3 04/04/2011 1148   BASOSABS 0.0 02/03/2021 0508   BASOSABS 0.1 03/21/2013 0705  BASOSABS 2 03/21/2013 7893    Recent Results (from the past 240 hour(s))  Resp Panel by RT-PCR (Flu A&B, Covid) Nasopharyngeal Swab     Status: Abnormal   Collection Time: 02/02/21  7:49 AM   Specimen: Nasopharyngeal Swab; Nasopharyngeal(NP) swabs in vial transport medium  Result Value Ref Range Status   SARS Coronavirus 2 by RT PCR NEGATIVE NEGATIVE Final    Comment: (NOTE) SARS-CoV-2 target nucleic acids are NOT DETECTED.  The SARS-CoV-2 RNA is generally detectable in upper respiratory specimens during the acute phase of infection. The lowest concentration of SARS-CoV-2 viral copies this assay can detect is 138 copies/mL. A  negative result does not preclude SARS-Cov-2 infection and should not be used as the sole basis for treatment or other patient management decisions. A negative result may occur with  improper specimen collection/handling, submission of specimen other than nasopharyngeal swab, presence of viral mutation(s) within the areas targeted by this assay, and inadequate number of viral copies(<138 copies/mL). A negative result must be combined with clinical observations, patient history, and epidemiological information. The expected result is Negative.  Fact Sheet for Patients:  BloggerCourse.com  Fact Sheet for Healthcare Providers:  SeriousBroker.it  This test is no t yet approved or cleared by the [place] FDA and  has been authorized for detection and/or diagnosis of SARS-CoV-2 by FDA under an Emergency Use Authorization (EUA). This EUA will remain  in effect (meaning this test can be used) for the duration of the COVID-19 declaration under Section 564(b)(1) of the Act, 21 U.S.C.section 360bbb-3(b)(1), unless the authorization is terminated  or revoked sooner.       Influenza A by PCR POSITIVE (A) NEGATIVE Final   Influenza B by PCR NEGATIVE NEGATIVE Final    Comment: (NOTE) The Xpert Xpress SARS-CoV-2/FLU/RSV plus assay is intended as an aid in the diagnosis of influenza from Nasopharyngeal swab specimens and should not be used as a sole basis for treatment. Nasal washings and aspirates are unacceptable for Xpert Xpress SARS-CoV-2/FLU/RSV testing.  Fact Sheet for Patients: BloggerCourse.com  Fact Sheet for Healthcare Providers: SeriousBroker.it  This test is not yet approved or cleared by the [place] FDA and has been authorized for detection and/or diagnosis of SARS-CoV-2 by FDA under an Emergency Use Authorization (EUA). This EUA will remain in effect (meaning this test  can be used) for the duration of the COVID-19 declaration under Section 564(b)(1) of the Act, 21 U.S.C. section 360bbb-3(b)(1), unless the authorization is terminated or revoked.  Performed at [facility], [address] [place], [place] [address]     Studies/Results: No results found.  Medications: Scheduled Meds:  enoxaparin (LOVENOX) injection  40 mg Subcutaneous Q24H   famotidine  20 mg Oral Q12H   HYDROmorphone   Intravenous Q4H   ketorolac  15 mg Intravenous Q6H   oseltamivir  75 mg Oral BID   senna-docusate  1 tablet Oral BID   Continuous Infusions:  diphenhydrAMINE     famotidine (PEPCID) IV     PRN Meds:.acetaminophen, diphenhydrAMINE **OR** diphenhydrAMINE, naloxone **AND** sodium chloride flush, polyethylene glycol, promethazine **OR** promethazine  Consultants: None  Procedures: None  Antibiotics: None  Assessment/Plan: Principal Problem:   Sickle cell pain crisis (HCC) Active Problems:   Influenza A  Hb Sickle Cell Disease with crisis: Reduce IV fluid today at [facility]. Continue weight based Dilaudid PCA at current setting, continue IV Toradol 15 mg Q 6 H for total of 5 days, continue oral oxycodone 10 mg Q4 hourly as  needed breakthrough pain. Monitor vitals very closely, Re-evaluate pain scale regularly, 2 L of Oxygen by [place]. Influenza A upper respiratory infection: Patient on Tamiflu.  Continue supportive care, incentive spirometry and supplemental oxygen. Leukocytosis: Patient remains afebrile.  Continue supportive care.  No clinical indication for antibiotics at this time. Sickle Cell Anemia: Hemoglobin appears to be stable at baseline.  There is no clinical indication for blood transfusion today.  Continue to monitor closely and transfuse as appropriate. Chronic pain Syndrome: Continue oral home medications as ordered.  Code Status: Full Code Family Communication: N/A Disposition Plan: Not yet ready for discharge  Merari Pion  If  7PM-7AM, please contact night-coverage.  02/04/2021, 12:30 PM  LOS: 2 days

## 2021-02-05 ENCOUNTER — Encounter (HOSPITAL_COMMUNITY): Payer: Self-pay | Admitting: Family Medicine

## 2021-02-05 DIAGNOSIS — J101 Influenza due to other identified influenza virus with other respiratory manifestations: Secondary | ICD-10-CM | POA: Diagnosis not present

## 2021-02-05 DIAGNOSIS — D5700 Hb-SS disease with crisis, unspecified: Secondary | ICD-10-CM | POA: Diagnosis not present

## 2021-02-05 LAB — COMPREHENSIVE METABOLIC PANEL
ALT: 14 U/L (ref 0–44)
AST: 25 U/L (ref 15–41)
Albumin: 3.7 g/dL (ref 3.5–5.0)
Alkaline Phosphatase: 56 U/L (ref 38–126)
Anion gap: 8 (ref 5–15)
BUN: 7 mg/dL (ref 6–20)
CO2: 25 mmol/L (ref 22–32)
Calcium: 8.7 mg/dL — ABNORMAL LOW (ref 8.9–10.3)
Chloride: 100 mmol/L (ref 98–111)
Creatinine, Ser: 0.61 mg/dL (ref 0.61–1.24)
GFR, Estimated: >60 mL/min (ref >60)
Glucose, Bld: 90 mg/dL (ref 70–99)
Potassium: 3.5 mmol/L (ref 3.5–5.1)
Sodium: 133 mmol/L — ABNORMAL LOW (ref 135–145)
Total Bilirubin: 2 mg/dL — ABNORMAL HIGH (ref 0.3–1.2)
Total Protein: 7 g/dL (ref 6.5–8.1)

## 2021-02-05 LAB — CBC WITH DIFFERENTIAL/PLATELET
Abs Immature Granulocytes: 0.17 10*3/uL — ABNORMAL HIGH (ref 0.00–0.07)
Basophils Absolute: 0 10*3/uL (ref 0.0–0.1)
Basophils Relative: 0 %
Eosinophils Absolute: 0.1 10*3/uL (ref 0.0–0.5)
Eosinophils Relative: 1 %
HCT: 21 % — ABNORMAL LOW (ref 39.0–52.0)
Hemoglobin: 7.3 g/dL — ABNORMAL LOW (ref 13.0–17.0)
Immature Granulocytes: 1 %
Lymphocytes Relative: 6 %
Lymphs Abs: 0.9 10*3/uL (ref 0.7–4.0)
MCH: 28.9 pg (ref 26.0–34.0)
MCHC: 34.8 g/dL (ref 30.0–36.0)
MCV: 83 fL (ref 80.0–100.0)
Monocytes Absolute: 2.4 10*3/uL — ABNORMAL HIGH (ref 0.1–1.0)
Monocytes Relative: 16 %
Neutro Abs: 12 10*3/uL — ABNORMAL HIGH (ref 1.7–7.7)
Neutrophils Relative %: 76 %
Platelets: 257 10*3/uL (ref 150–400)
RBC: 2.53 MIL/uL — ABNORMAL LOW (ref 4.22–5.81)
RDW: 19.7 % — ABNORMAL HIGH (ref 11.5–15.5)
WBC: 15.6 10*3/uL — ABNORMAL HIGH (ref 4.0–10.5)
nRBC: 2 % — ABNORMAL HIGH (ref 0.0–0.2)

## 2021-02-05 MED ORDER — HYDROCODONE-ACETAMINOPHEN 5-325 MG PO TABS
2.0000 | ORAL_TABLET | ORAL | Status: DC | PRN
  Administered 2021-02-05: 2 via ORAL
  Filled 2021-02-05: qty 2

## 2021-02-05 MED ORDER — FOLIC ACID 1 MG PO TABS
1.0000 mg | ORAL_TABLET | Freq: Every day | ORAL | Status: DC
  Administered 2021-02-05 – 2021-02-08 (×4): 1 mg via ORAL
  Filled 2021-02-05 (×4): qty 1

## 2021-02-05 MED ORDER — CYCLOBENZAPRINE HCL 5 MG PO TABS
5.0000 mg | ORAL_TABLET | Freq: Three times a day (TID) | ORAL | Status: DC
  Administered 2021-02-05 – 2021-02-08 (×8): 5 mg via ORAL
  Filled 2021-02-05 (×8): qty 1

## 2021-02-05 NOTE — Progress Notes (Signed)
Patient ID: Andrew Kirk, male   DOB: 2000-05-28, 20 y.o.   MRN: [id number] Subjective: Andrew Kirk is a a 20 year old male with a medical history significant for sickle cell disease that was admitted for influenza A in the setting of sickle cell pain crisis.  Patient continues to complain of significant lower back pain, he rates his pain a 7/10 today slightly improved from yesterday but still significantly above his goal of below 4/10. He has no other new complaint today except for ongoing cough and occasional bloodstained sputum. No chest pain. He denies any headache, fever, shortness of breath, nausea, vomiting or diarrhea.  Objective:  Vital signs in last 24 hours:  Vitals:   02/05/21 0827 02/05/21 1206 02/05/21 1424 02/05/21 1613  BP:      Pulse:      Resp: 17 18  17   Temp:      TempSrc:      SpO2: 96% 97%  96%  Weight:   74.8 kg   Height:   5\' 8"  (1.727 m)     Intake/Output from previous day:   Intake/Output Summary (Last 24 hours) at 02/05/2021 1622 Last data filed at 02/05/2021 1615 Gross per 24 hour  Intake 362.5 ml  Output 2535 ml  Net -2172.5 ml     Physical Exam: General: Alert, awake, oriented x3, in some pain distress.  HEENT: [place]/AT PEERL, EOMI Neck: Trachea midline,  no masses, no thyromegal,y no JVD, no carotid bruit OROPHARYNX:  Moist, No exudate/ erythema/lesions.  Heart: Regular rate and rhythm, without murmurs, rubs, gallops, PMI non-displaced, no heaves or thrills on palpation.  Lungs: Clear to auscultation, no wheezing or rhonchi noted. No increased vocal fremitus resonant to percussion  Abdomen: Soft, nontender, nondistended, positive bowel sounds, no masses no hepatosplenomegaly noted..  Neuro: No focal neurological deficits noted cranial nerves II through XII grossly intact. DTRs 2+ bilaterally upper and lower extremities. Strength 5 out of 5 in bilateral upper and lower extremities. Musculoskeletal: No warm swelling or erythema around joints, no spinal  tenderness noted. Psychiatric: Patient alert and oriented x3, good insight and cognition, good recent to remote recall. Lymph node survey: No cervical axillary or inguinal lymphadenopathy noted.  Lab Results:  Basic Metabolic Panel:    Component Value Date/Time   NA 133 (L) 02/05/2021 0558   NA 138 03/21/2013 0705   K 3.5 02/05/2021 0558   K 4.4 03/21/2013 0705   CL 100 02/05/2021 0558   CL 108 (H) 03/21/2013 0705   CO2 25 02/05/2021 0558   CO2 25 03/21/2013 0705   BUN 7 02/05/2021 0558   BUN 7 (L) 03/21/2013 0705   CREATININE 0.61 02/05/2021 0558   CREATININE 0.52 (L) 03/21/2013 0705   GLUCOSE 90 02/05/2021 0558   GLUCOSE 92 03/21/2013 0705   CALCIUM 8.7 (L) 02/05/2021 0558   CALCIUM 9.3 03/21/2013 0705   CBC:    Component Value Date/Time   WBC 15.6 (H) 02/05/2021 0558   HGB 7.3 (L) 02/05/2021 0558   HGB 8.8 (L) 03/21/2013 0705   HCT 21.0 (L) 02/05/2021 0558   HCT 25.9 (L) 03/21/2013 0705   PLT 257 02/05/2021 0558   PLT 462 (H) 03/21/2013 0705   MCV 83.0 02/05/2021 0558   MCV 84 03/21/2013 0705   NEUTROABS 12.0 (H) 02/05/2021 0558   NEUTROABS 6.7 (H) 04/04/2011 1148   LYMPHSABS 0.9 02/05/2021 0558   LYMPHSABS 1.5 04/04/2011 1148   MONOABS 2.4 (H) 02/05/2021 0558   MONOABS 1.0 (H) 04/04/2011 1148  EOSABS 0.1 02/05/2021 0558   EOSABS 0.3 04/04/2011 1148   BASOSABS 0.0 02/05/2021 0558   BASOSABS 0.1 03/21/2013 0705   BASOSABS 2 03/21/2013 4098    Recent Results (from the past 240 hour(s))  Resp Panel by RT-PCR (Flu A&B, Covid) Nasopharyngeal Swab     Status: Abnormal   Collection Time: 02/02/21  7:49 AM   Specimen: Nasopharyngeal Swab; Nasopharyngeal(NP) swabs in vial transport medium  Result Value Ref Range Status   SARS Coronavirus 2 by RT PCR NEGATIVE NEGATIVE Final    Comment: (NOTE) SARS-CoV-2 target nucleic acids are NOT DETECTED.  The SARS-CoV-2 RNA is generally detectable in upper respiratory specimens during the acute phase of infection. The  lowest concentration of SARS-CoV-2 viral copies this assay can detect is 138 copies/mL. A negative result does not preclude SARS-Cov-2 infection and should not be used as the sole basis for treatment or other patient management decisions. A negative result may occur with  improper specimen collection/handling, submission of specimen other than nasopharyngeal swab, presence of viral mutation(s) within the areas targeted by this assay, and inadequate number of viral copies(<138 copies/mL). A negative result must be combined with clinical observations, patient history, and epidemiological information. The expected result is Negative.  Fact Sheet for Patients:  BloggerCourse.com  Fact Sheet for Healthcare Providers:  SeriousBroker.it  This test is no t yet approved or cleared by the [place] FDA and  has been authorized for detection and/or diagnosis of SARS-CoV-2 by FDA under an Emergency Use Authorization (EUA). This EUA will remain  in effect (meaning this test can be used) for the duration of the COVID-19 declaration under Section 564(b)(1) of the Act, 21 U.S.C.section 360bbb-3(b)(1), unless the authorization is terminated  or revoked sooner.       Influenza A by PCR POSITIVE (A) NEGATIVE Final   Influenza B by PCR NEGATIVE NEGATIVE Final    Comment: (NOTE) The Xpert Xpress SARS-CoV-2/FLU/RSV plus assay is intended as an aid in the diagnosis of influenza from Nasopharyngeal swab specimens and should not be used as a sole basis for treatment. Nasal washings and aspirates are unacceptable for Xpert Xpress SARS-CoV-2/FLU/RSV testing.  Fact Sheet for Patients: BloggerCourse.com  Fact Sheet for Healthcare Providers: SeriousBroker.it  This test is not yet approved or cleared by the [place] FDA and has been authorized for detection and/or diagnosis of SARS-CoV-2 by FDA  under an Emergency Use Authorization (EUA). This EUA will remain in effect (meaning this test can be used) for the duration of the COVID-19 declaration under Section 564(b)(1) of the Act, 21 U.S.C. section 360bbb-3(b)(1), unless the authorization is terminated or revoked.  Performed at [facility], [address], [place], [place] [address]     Studies/Results: No results found.  Medications: Scheduled Meds:  enoxaparin (LOVENOX) injection  40 mg Subcutaneous Q24H   famotidine  20 mg Oral Q12H   HYDROmorphone   Intravenous Q4H   ketorolac  15 mg Intravenous Q6H   oseltamivir  75 mg Oral BID   senna-docusate  1 tablet Oral BID   Continuous Infusions:  diphenhydrAMINE     famotidine (PEPCID) IV     PRN Meds:.acetaminophen, diphenhydrAMINE **OR** diphenhydrAMINE, naloxone **AND** sodium chloride flush, polyethylene glycol, promethazine **OR** promethazine  Consultants: None  Procedures: None  Antibiotics: None  Assessment/Plan: Principal Problem:   Sickle cell pain crisis (HCC) Active Problems:   Influenza A  Hb Sickle Cell Disease with crisis: [facility] at [facility]. Continue weight based Dilaudid PCA at  current setting, continue IV Toradol 15 mg Q 6 H for total of 5 days, continue oral oxycodone 10 mg Q4 hourly as needed breakthrough pain. Monitor vitals very closely, Re-evaluate pain scale regularly, 2 L of Oxygen by [place]. Influenza A upper respiratory infection: Continue Tamiflu and other supportive care, including incentive spirometry and supplemental oxygen. Leukocytosis: Patient remains afebrile. Continue supportive care. No clinical indication for antibiotics at this time. 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 There is no clinical indication for blood transfusion today.  Continue to monitor closely and transfuse as appropriate. Chronic pain Syndrome: Continue oral home medications as ordered.  Code Status: Full Code Family  Communication: N/A Disposition Plan: Not yet ready for discharge  Kerina Simoneau  If 7PM-7AM, please contact night-coverage.  02/05/2021, 4:22 PM  LOS: 3 days

## 2021-02-06 ENCOUNTER — Encounter (HOSPITAL_COMMUNITY): Payer: Self-pay | Admitting: Family Medicine

## 2021-02-06 ENCOUNTER — Inpatient Hospital Stay (HOSPITAL_COMMUNITY): Payer: Medicaid Other

## 2021-02-06 DIAGNOSIS — R0602 Shortness of breath: Secondary | ICD-10-CM | POA: Diagnosis not present

## 2021-02-06 DIAGNOSIS — R079 Chest pain, unspecified: Secondary | ICD-10-CM | POA: Diagnosis not present

## 2021-02-06 DIAGNOSIS — J90 Pleural effusion, not elsewhere classified: Secondary | ICD-10-CM | POA: Diagnosis not present

## 2021-02-06 DIAGNOSIS — D5700 Hb-SS disease with crisis, unspecified: Secondary | ICD-10-CM | POA: Diagnosis not present

## 2021-02-06 LAB — COMPREHENSIVE METABOLIC PANEL
ALT: 14 U/L (ref 0–44)
AST: 17 U/L (ref 15–41)
Albumin: 3.6 g/dL (ref 3.5–5.0)
Alkaline Phosphatase: 50 U/L (ref 38–126)
Anion gap: 10 (ref 5–15)
BUN: 7 mg/dL (ref 6–20)
CO2: 25 mmol/L (ref 22–32)
Calcium: 8.8 mg/dL — ABNORMAL LOW (ref 8.9–10.3)
Chloride: 100 mmol/L (ref 98–111)
Creatinine, Ser: 0.69 mg/dL (ref 0.61–1.24)
GFR, Estimated: >60 mL/min (ref >60)
Glucose, Bld: 105 mg/dL — ABNORMAL HIGH (ref 70–99)
Potassium: 3.3 mmol/L — ABNORMAL LOW (ref 3.5–5.1)
Sodium: 135 mmol/L (ref 135–145)
Total Bilirubin: 2.6 mg/dL — ABNORMAL HIGH (ref 0.3–1.2)
Total Protein: 7.5 g/dL (ref 6.5–8.1)

## 2021-02-06 LAB — CBC WITH DIFFERENTIAL/PLATELET
Abs Immature Granulocytes: 0.1 10*3/uL — ABNORMAL HIGH (ref 0.00–0.07)
Basophils Absolute: 0 10*3/uL (ref 0.0–0.1)
Basophils Relative: 0 %
Eosinophils Absolute: 0.1 10*3/uL (ref 0.0–0.5)
Eosinophils Relative: 0 %
HCT: 21.6 % — ABNORMAL LOW (ref 39.0–52.0)
Hemoglobin: 7.6 g/dL — ABNORMAL LOW (ref 13.0–17.0)
Immature Granulocytes: 1 %
Lymphocytes Relative: 11 %
Lymphs Abs: 1.7 10*3/uL (ref 0.7–4.0)
MCH: 28.8 pg (ref 26.0–34.0)
MCHC: 35.2 g/dL (ref 30.0–36.0)
MCV: 81.8 fL (ref 80.0–100.0)
Monocytes Absolute: 2.7 10*3/uL — ABNORMAL HIGH (ref 0.1–1.0)
Monocytes Relative: 17 %
Neutro Abs: 11.6 10*3/uL — ABNORMAL HIGH (ref 1.7–7.7)
Neutrophils Relative %: 71 %
Platelets: 309 10*3/uL (ref 150–400)
RBC: 2.64 MIL/uL — ABNORMAL LOW (ref 4.22–5.81)
RDW: 19.1 % — ABNORMAL HIGH (ref 11.5–15.5)
WBC: 16.2 10*3/uL — ABNORMAL HIGH (ref 4.0–10.5)
nRBC: 1.2 % — ABNORMAL HIGH (ref 0.0–0.2)

## 2021-02-06 LAB — LACTIC ACID, PLASMA
Lactic Acid, Venous: 0.8 mmol/L (ref 0.5–1.9)
Lactic Acid, Venous: 1.4 mmol/L (ref 0.5–1.9)

## 2021-02-06 MED ORDER — SODIUM CHLORIDE 0.9 % IV SOLN
2.0000 g | Freq: Every day | INTRAVENOUS | Status: DC
  Administered 2021-02-06 – 2021-02-07 (×2): 2 g via INTRAVENOUS
  Filled 2021-02-06 (×3): qty 20

## 2021-02-06 NOTE — Progress Notes (Signed)
Subjective: Andrew Kirk is a 20 year old male with a medical history significant for sickle cell disease was admitted for influenza A  in the setting of sickle cell pain crisis.   Patient has been febrile with a maximum temperature of 103.1. Patient endorses a productive cough and chest pain. He is also complaining of low back pain. He denies headache, dizziness, shortness of breath, nausea, vomiting, or diarrhea.   Objective:  Vital signs in last 24 hours:  Vitals:   02/06/21 0841 02/06/21 0941 02/06/21 1102 02/06/21 1244  BP:   (!) 128/59   Pulse:   96   Resp: 16 16 20 16   Temp:   (!) 103.1 F (39.5 C)   TempSrc:   Oral   SpO2: 95% 94% 91% 96%  Weight:      Height:        Intake/Output from previous day:   Intake/Output Summary (Last 24 hours) at 02/06/2021 1419 Last data filed at 02/06/2021 1102 Gross per 24 hour  Intake 722 ml  Output 1620 ml  Net -898 ml    Physical Exam: General: Alert, awake, oriented x3, in no acute distress.  HEENT: [place]/AT PEERL, EOMI Neck: Trachea midline,  no masses, no thyromegal,y no JVD, no carotid bruit OROPHARYNX:  Moist, No exudate/ erythema/lesions.  Heart: Regular rate and rhythm, without murmurs, rubs, gallops, PMI non-displaced, no heaves or thrills on palpation.  Lungs: Clear to auscultation, no wheezing or rhonchi noted. No increased vocal fremitus resonant to percussion  Abdomen: Soft, nontender, nondistended, positive bowel sounds, no masses no hepatosplenomegaly noted..  Neuro: No focal neurological deficits noted cranial nerves II through XII grossly intact. DTRs 2+ bilaterally upper and lower extremities. Strength 5 out of 5 in bilateral upper and lower extremities. Musculoskeletal: No warm swelling or erythema around joints, no spinal tenderness noted. Psychiatric: Patient alert and oriented x3, good insight and cognition, good recent to remote recall. Lymph node survey: No cervical axillary or inguinal lymphadenopathy  noted.  Lab Results:  Basic Metabolic Panel:    Component Value Date/Time   NA 135 02/06/2021 1249   NA 138 03/21/2013 0705   K 3.3 (L) 02/06/2021 1249   K 4.4 03/21/2013 0705   CL 100 02/06/2021 1249   CL 108 (H) 03/21/2013 0705   CO2 25 02/06/2021 1249   CO2 25 03/21/2013 0705   BUN 7 02/06/2021 1249   BUN 7 (L) 03/21/2013 0705   CREATININE 0.69 02/06/2021 1249   CREATININE 0.52 (L) 03/21/2013 0705   GLUCOSE 105 (H) 02/06/2021 1249   GLUCOSE 92 03/21/2013 0705   CALCIUM 8.8 (L) 02/06/2021 1249   CALCIUM 9.3 03/21/2013 0705   CBC:    Component Value Date/Time   WBC 16.2 (H) 02/06/2021 0927   HGB 7.6 (L) 02/06/2021 0927   HGB 8.8 (L) 03/21/2013 0705   HCT 21.6 (L) 02/06/2021 0927   HCT 25.9 (L) 03/21/2013 0705   PLT 309 02/06/2021 0927   PLT 462 (H) 03/21/2013 0705   MCV 81.8 02/06/2021 0927   MCV 84 03/21/2013 0705   NEUTROABS 11.6 (H) 02/06/2021 0927   NEUTROABS 6.7 (H) 04/04/2011 1148   LYMPHSABS 1.7 02/06/2021 0927   LYMPHSABS 1.5 04/04/2011 1148   MONOABS 2.7 (H) 02/06/2021 0927   MONOABS 1.0 (H) 04/04/2011 1148   EOSABS 0.1 02/06/2021 0927   EOSABS 0.3 04/04/2011 1148   BASOSABS 0.0 02/06/2021 0927   BASOSABS 0.1 03/21/2013 0705   BASOSABS 2 03/21/2013 0705    Recent Results (  from the past 240 hour(s))  Resp Panel by RT-PCR (Flu A&B, Covid) Nasopharyngeal Swab     Status: Abnormal   Collection Time: 02/02/21  7:49 AM   Specimen: Nasopharyngeal Swab; Nasopharyngeal(NP) swabs in vial transport medium  Result Value Ref Range Status   SARS Coronavirus 2 by RT PCR NEGATIVE NEGATIVE Final    Comment: (NOTE) SARS-CoV-2 target nucleic acids are NOT DETECTED.  The SARS-CoV-2 RNA is generally detectable in upper respiratory specimens during the acute phase of infection. The lowest concentration of SARS-CoV-2 viral copies this assay can detect is 138 copies/mL. A negative result does not preclude SARS-Cov-2 infection and should not be used as the sole basis  for treatment or other patient management decisions. A negative result may occur with  improper specimen collection/handling, submission of specimen other than nasopharyngeal swab, presence of viral mutation(s) within the areas targeted by this assay, and inadequate number of viral copies(<138 copies/mL). A negative result must be combined with clinical observations, patient history, and epidemiological information. The expected result is Negative.  Fact Sheet for Patients:  BloggerCourse.com  Fact Sheet for Healthcare Providers:  SeriousBroker.it  This test is no t yet approved or cleared by the [place] FDA and  has been authorized for detection and/or diagnosis of SARS-CoV-2 by FDA under an Emergency Use Authorization (EUA). This EUA will remain  in effect (meaning this test can be used) for the duration of the COVID-19 declaration under Section 564(b)(1) of the Act, 21 U.S.C.section 360bbb-3(b)(1), unless the authorization is terminated  or revoked sooner.       Influenza A by PCR POSITIVE (A) NEGATIVE Final   Influenza B by PCR NEGATIVE NEGATIVE Final    Comment: (NOTE) The Xpert Xpress SARS-CoV-2/FLU/RSV plus assay is intended as an aid in the diagnosis of influenza from Nasopharyngeal swab specimens and should not be used as a sole basis for treatment. Nasal washings and aspirates are unacceptable for Xpert Xpress SARS-CoV-2/FLU/RSV testing.  Fact Sheet for Patients: BloggerCourse.com  Fact Sheet for Healthcare Providers: SeriousBroker.it  This test is not yet approved or cleared by the [place] FDA and has been authorized for detection and/or diagnosis of SARS-CoV-2 by FDA under an Emergency Use Authorization (EUA). This EUA will remain in effect (meaning this test can be used) for the duration of the COVID-19 declaration under Section 564(b)(1) of the Act,  21 U.S.C. section 360bbb-3(b)(1), unless the authorization is terminated or revoked.  Performed at [facility], [address] [place], [place] [address]     Studies/Results: DG Chest 2 View  Result Date: 02/06/2021 CLINICAL DATA:  Central chest pain and shortness of breath. Sickle cell disease. EXAM: CHEST - 2 VIEW COMPARISON:  Two-view chest x-ray 11/22/that two-view chest x-ray 02/02/2021 FINDINGS: Heart size is normal. New bilateral pleural effusions are present. Bibasilar airspace disease is noted, right greater than left. Upper lung fields are clear. IMPRESSION: New bilateral pleural effusions and bibasilar airspace disease, right greater than left. Findings could represent atelectasis or infection. Electronically Signed   By: Marin Roberts M.D.   On: 02/06/2021 13:15    Medications: Scheduled Meds:  cyclobenzaprine  5 mg Oral TID   enoxaparin (LOVENOX) injection  40 mg Subcutaneous Q24H   famotidine  20 mg Oral Q12H   folic acid  1 mg Oral Daily   HYDROmorphone   Intravenous Q4H   ketorolac  15 mg Intravenous Q6H   oseltamivir  75 mg Oral BID   senna-docusate  1 tablet Oral  BID   Continuous Infusions:  cefTRIAXone (ROCEPHIN)  IV     diphenhydrAMINE     famotidine (PEPCID) IV     PRN Meds:.acetaminophen, diphenhydrAMINE **OR** diphenhydrAMINE, HYDROcodone-acetaminophen, naloxone **AND** sodium chloride flush, polyethylene glycol, promethazine **OR** promethazine  Consultants: None  Procedures: None  Antibiotics: None  Assessment/Plan: Principal Problem:   Sickle cell pain crisis (HCC) Active Problems:   Influenza A  Sickle cell disease with pain crisis: Continue IV fluids at [facility] Continue IV Dilaudid PCA, no changes today Toradol 15 mg IV every 6 hours for total of 5 days Oxycodone 10 mg every 4 hours as needed for breakthrough pain Monitor vital signs very closely, reevaluate pain scale regularly, and supplemental oxygen as  needed  Influenza A upper respiratory infection: Continue Tamiflu and other supportive care.  Patient febrile maximum temperature 103.1.  Review chest x-ray as results become available.  Blood cultures x2.  Leukocytosis: Patient febrile.  Review chest x-ray as results become available.  Continue supportive care.  Anemia of chronic disease: Patient's hemoglobin is stable and consistent with his baseline.  There is no clinical indication for blood transfusion at this time.  Chronic pain syndrome: Continue home medications     Code Status: Full Code Family Communication: N/A Disposition Plan: Not yet ready for discharge  Willman Cuny Rennis Petty  APRN, MSN, FNP-C [facility] [address] [place], [place] [address] [phone]  If 5PM-8AM, please contact night-coverage.  02/06/2021, 2:19 PM  LOS: 4 days

## 2021-02-06 NOTE — Progress Notes (Signed)
   02/06/21 1102  Assess: MEWS Score  Temp (!) 103.1 F (39.5 C)  BP (!) 128/59  Pulse Rate 96  Resp 20  SpO2 91 %  Assess: MEWS Score  MEWS Temp 2  MEWS Systolic 0  MEWS Pulse 0  MEWS RR 0  MEWS LOC 0  MEWS Score 2  MEWS Score Color Yellow  Assess: if the MEWS score is Yellow or Red  Were vital signs taken at a resting state? Yes  Focused Assessment Change from prior assessment (see assessment flowsheet)  Does the patient meet 2 or more of the SIRS criteria? No  Does the patient have a confirmed or suspected source of infection? Yes  MEWS guidelines implemented *See Row Information* Yes  Treat  MEWS Interventions Administered scheduled meds/treatments  Notify: Charge Nurse/RN  Name of Charge Nurse/RN Notified Jilda Panda , RN  Date Charge Nurse/RN Notified 02/06/21  Time Charge Nurse/RN Notified 1108  Notify: Provider  Provider Name/Title Dr Hyman Hopes  Date Provider Notified 02/06/21  Time Provider Notified 1110  Notification Type Page  Notification Reason Change in status  Provider response See new orders  Date of Provider Response 02/06/21  Time of Provider Response 1111  Document  Patient Outcome Stabilized after interventions  Assess: SIRS CRITERIA  SIRS Temperature  1  SIRS Pulse 1  SIRS Respirations  0  SIRS WBC 0  SIRS Score Sum  2

## 2021-02-07 DIAGNOSIS — D5700 Hb-SS disease with crisis, unspecified: Secondary | ICD-10-CM | POA: Diagnosis not present

## 2021-02-07 LAB — COMPREHENSIVE METABOLIC PANEL
ALT: 14 U/L (ref 0–44)
AST: 20 U/L (ref 15–41)
Albumin: 3.4 g/dL — ABNORMAL LOW (ref 3.5–5.0)
Alkaline Phosphatase: 51 U/L (ref 38–126)
Anion gap: 10 (ref 5–15)
BUN: 7 mg/dL (ref 6–20)
CO2: 25 mmol/L (ref 22–32)
Calcium: 8.7 mg/dL — ABNORMAL LOW (ref 8.9–10.3)
Chloride: 104 mmol/L (ref 98–111)
Creatinine, Ser: 0.6 mg/dL — ABNORMAL LOW (ref 0.61–1.24)
GFR, Estimated: >60 mL/min (ref >60)
Glucose, Bld: 91 mg/dL (ref 70–99)
Potassium: 4.2 mmol/L (ref 3.5–5.1)
Sodium: 137 mmol/L (ref 135–145)
Total Bilirubin: 1.4 mg/dL — ABNORMAL HIGH (ref 0.3–1.2)
Total Protein: 7.4 g/dL (ref 6.5–8.1)

## 2021-02-07 LAB — CBC
HCT: 19.8 % — ABNORMAL LOW (ref 39.0–52.0)
Hemoglobin: 7 g/dL — ABNORMAL LOW (ref 13.0–17.0)
MCH: 28.7 pg (ref 26.0–34.0)
MCHC: 35.4 g/dL (ref 30.0–36.0)
MCV: 81.1 fL (ref 80.0–100.0)
Platelets: 323 10*3/uL (ref 150–400)
RBC: 2.44 MIL/uL — ABNORMAL LOW (ref 4.22–5.81)
RDW: 18.5 % — ABNORMAL HIGH (ref 11.5–15.5)
WBC: 17.8 10*3/uL — ABNORMAL HIGH (ref 4.0–10.5)
nRBC: 0.6 % — ABNORMAL HIGH (ref 0.0–0.2)

## 2021-02-07 MED ORDER — HYDROMORPHONE HCL 1 MG/ML IJ SOLN
1.0000 mg | Freq: Four times a day (QID) | INTRAMUSCULAR | Status: DC | PRN
  Administered 2021-02-07: 1 mg via INTRAVENOUS
  Filled 2021-02-07: qty 1

## 2021-02-07 MED ORDER — CYCLOBENZAPRINE HCL 5 MG PO TABS: 5.0000 mg | ORAL_TABLET | Freq: Three times a day (TID) | ORAL | Status: DC | PRN

## 2021-02-07 NOTE — Progress Notes (Signed)
Subjective: Andrew Kirk is a 20 year old male with a medical history significant for sickle cell disease was admitted for influenza A  in the setting of sickle cell pain crisis.   Patient has been afebrile overnight. He says that pain intensity has improved. He continues to endorse a productive cough and chest pain. He is also complaining of low back pain rated at 5/10.  He denies headache, dizziness, shortness of breath, nausea, vomiting, or diarrhea.   Objective:  Vital signs in last 24 hours:  Vitals:   02/07/21 0702 02/07/21 1009 02/07/21 1123 02/07/21 1440  BP: (!) 125/55 (!) 128/55  121/73  Pulse: 90 81  71  Resp: 16 16 (!) 23 15  Temp: 99.5 F (37.5 C) 98.8 F (37.1 C)  99 F (37.2 C)  TempSrc: Oral Oral  Oral  SpO2: 95% 97% 97% 100%  Weight:      Height:        Intake/Output from previous day:   Intake/Output Summary (Last 24 hours) at 02/07/2021 1753 Last data filed at 02/07/2021 0600 Gross per 24 hour  Intake 338.41 ml  Output 2475 ml  Net -2136.59 ml    Physical Exam: General: Alert, awake, oriented x3, in no acute distress.  HEENT: [place]/AT PEERL, EOMI Neck: Trachea midline,  no masses, no thyromegal,y no JVD, no carotid bruit OROPHARYNX:  Moist, No exudate/ erythema/lesions.  Heart: Regular rate and rhythm, without murmurs, rubs, gallops, PMI non-displaced, no heaves or thrills on palpation.  Lungs: Clear to auscultation, no wheezing or rhonchi noted. No increased vocal fremitus resonant to percussion  Abdomen: Soft, nontender, nondistended, positive bowel sounds, no masses no hepatosplenomegaly noted..  Neuro: No focal neurological deficits noted cranial nerves II through XII grossly intact. DTRs 2+ bilaterally upper and lower extremities. Strength 5 out of 5 in bilateral upper and lower extremities. Musculoskeletal: No warm swelling or erythema around joints, no spinal tenderness noted. Psychiatric: Patient alert and oriented x3, good insight and cognition, good  recent to remote recall. Lymph node survey: No cervical axillary or inguinal lymphadenopathy noted.  Lab Results:  Basic Metabolic Panel:    Component Value Date/Time   NA 137 02/07/2021 0528   NA 138 03/21/2013 0705   K 4.2 02/07/2021 0528   K 4.4 03/21/2013 0705   CL 104 02/07/2021 0528   CL 108 (H) 03/21/2013 0705   CO2 25 02/07/2021 0528   CO2 25 03/21/2013 0705   BUN 7 02/07/2021 0528   BUN 7 (L) 03/21/2013 0705   CREATININE 0.60 (L) 02/07/2021 0528   CREATININE 0.52 (L) 03/21/2013 0705   GLUCOSE 91 02/07/2021 0528   GLUCOSE 92 03/21/2013 0705   CALCIUM 8.7 (L) 02/07/2021 0528   CALCIUM 9.3 03/21/2013 0705   CBC:    Component Value Date/Time   WBC 17.8 (H) 02/07/2021 0528   HGB 7.0 (L) 02/07/2021 0528   HGB 8.8 (L) 03/21/2013 0705   HCT 19.8 (L) 02/07/2021 0528   HCT 25.9 (L) 03/21/2013 0705   PLT 323 02/07/2021 0528   PLT 462 (H) 03/21/2013 0705   MCV 81.1 02/07/2021 0528   MCV 84 03/21/2013 0705   NEUTROABS 11.6 (H) 02/06/2021 0927   NEUTROABS 6.7 (H) 04/04/2011 1148   LYMPHSABS 1.7 02/06/2021 0927   LYMPHSABS 1.5 04/04/2011 1148   MONOABS 2.7 (H) 02/06/2021 0927   MONOABS 1.0 (H) 04/04/2011 1148   EOSABS 0.1 02/06/2021 0927   EOSABS 0.3 04/04/2011 1148   BASOSABS 0.0 02/06/2021 0927   BASOSABS  0.1 03/21/2013 0705   BASOSABS 2 03/21/2013 8366    Recent Results (from the past 240 hour(s))  Resp Panel by RT-PCR (Flu A&B, Covid) Nasopharyngeal Swab     Status: Abnormal   Collection Time: 02/02/21  7:49 AM   Specimen: Nasopharyngeal Swab; Nasopharyngeal(NP) swabs in vial transport medium  Result Value Ref Range Status   SARS Coronavirus 2 by RT PCR NEGATIVE NEGATIVE Final    Comment: (NOTE) SARS-CoV-2 target nucleic acids are NOT DETECTED.  The SARS-CoV-2 RNA is generally detectable in upper respiratory specimens during the acute phase of infection. The lowest concentration of SARS-CoV-2 viral copies this assay can detect is 138 copies/mL. A negative  result does not preclude SARS-Cov-2 infection and should not be used as the sole basis for treatment or other patient management decisions. A negative result may occur with  improper specimen collection/handling, submission of specimen other than nasopharyngeal swab, presence of viral mutation(s) within the areas targeted by this assay, and inadequate number of viral copies(<138 copies/mL). A negative result must be combined with clinical observations, patient history, and epidemiological information. The expected result is Negative.  Fact Sheet for Patients:  BloggerCourse.com  Fact Sheet for Healthcare Providers:  SeriousBroker.it  This test is no t yet approved or cleared by the [place] FDA and  has been authorized for detection and/or diagnosis of SARS-CoV-2 by FDA under an Emergency Use Authorization (EUA). This EUA will remain  in effect (meaning this test can be used) for the duration of the COVID-19 declaration under Section 564(b)(1) of the Act, 21 U.S.C.section 360bbb-3(b)(1), unless the authorization is terminated  or revoked sooner.       Influenza A by PCR POSITIVE (A) NEGATIVE Final   Influenza B by PCR NEGATIVE NEGATIVE Final    Comment: (NOTE) The Xpert Xpress SARS-CoV-2/FLU/RSV plus assay is intended as an aid in the diagnosis of influenza from Nasopharyngeal swab specimens and should not be used as a sole basis for treatment. Nasal washings and aspirates are unacceptable for Xpert Xpress SARS-CoV-2/FLU/RSV testing.  Fact Sheet for Patients: BloggerCourse.com  Fact Sheet for Healthcare Providers: SeriousBroker.it  This test is not yet approved or cleared by the [place] FDA and has been authorized for detection and/or diagnosis of SARS-CoV-2 by FDA under an Emergency Use Authorization (EUA). This EUA will remain in effect (meaning this test can be  used) for the duration of the COVID-19 declaration under Section 564(b)(1) of the Act, 21 U.S.C. section 360bbb-3(b)(1), unless the authorization is terminated or revoked.  Performed at [facility], [address] [place], [place] [address]   Culture, blood (Routine X 2) w Reflex to ID Panel     Status: None (Preliminary result)   Collection Time: 02/06/21 12:49 PM   Specimen: BLOOD RIGHT HAND  Result Value Ref Range Status   Specimen Description   Final    BLOOD RIGHT HAND Performed at [facility], [address] [place], [place] [address]    Special Requests   Final    BOTTLES DRAWN AEROBIC ONLY Blood Culture adequate volume Performed at [facility], [address], [place], [place] [address]    Culture   Final    NO GROWTH < 24 HOURS Performed at [facility], [address], [place], [place] [address]    Report Status PENDING  Incomplete  Culture, blood (Routine X 2) w Reflex to ID Panel     Status: None (Preliminary result)   Collection  Time: 02/06/21 12:49 PM   Specimen: BLOOD RIGHT HAND  Result Value Ref Range Status   Specimen Description   Final    BLOOD RIGHT HAND Performed at [facility], [address], [place], [place] [address]    Special Requests   Final    BOTTLES DRAWN AEROBIC ONLY Blood Culture adequate volume Performed at [facility], [address], [place], [place] [address]    Culture   Final    NO GROWTH < 24 HOURS Performed at [facility], [address] [place], [place] [address]    Report Status PENDING  Incomplete    Studies/Results: DG Chest 2 View  Result Date: 02/06/2021 CLINICAL DATA:  Central chest pain and shortness of breath. Sickle cell disease. EXAM: CHEST - 2 VIEW COMPARISON:  Two-view chest x-ray 11/22/that two-view chest x-ray 02/02/2021 FINDINGS: Heart size is normal. New bilateral pleural effusions are present. Bibasilar  airspace disease is noted, right greater than left. Upper lung fields are clear. IMPRESSION: New bilateral pleural effusions and bibasilar airspace disease, right greater than left. Findings could represent atelectasis or infection. Electronically Signed   By: Marin Roberts M.D.   On: 02/06/2021 13:15    Medications: Scheduled Meds:  cyclobenzaprine  5 mg Oral TID   enoxaparin (LOVENOX) injection  40 mg Subcutaneous Q24H   famotidine  20 mg Oral Q12H   folic acid  1 mg Oral Daily   senna-docusate  1 tablet Oral BID   Continuous Infusions:  cefTRIAXone (ROCEPHIN)  IV 2 g (02/07/21 1122)   diphenhydrAMINE     famotidine (PEPCID) IV     PRN Meds:.acetaminophen, cyclobenzaprine, diphenhydrAMINE **OR** diphenhydrAMINE, HYDROcodone-acetaminophen, HYDROmorphone (DILAUDID) injection, polyethylene glycol, promethazine **OR** promethazine  Consultants: None  Procedures: None  Antibiotics: None  Assessment/Plan: Principal Problem:   Sickle cell pain crisis (HCC) Active Problems:   Influenza A  Sickle cell disease with pain crisis: Continue IV fluids at [facility] Discontinue IV dilaudid PCA  Toradol 15 mg IV every 6 hours for total of 5 days Dilaudid 1 mg every 6 hours as needed Percocet every 4 hours as needed for breakthrough pain Monitor vital signs very closely, reevaluate pain scale regularly, and supplemental oxygen as needed  Influenza A upper respiratory infection: Continue Tamiflu and other supportive care. Patient has been afebrile overnight. Chest xray shows no acute cardiopulmonary process.  Possible HCAP:  New bilateral pleural effusions. Patient febrile on yesterday with a max temp of 103.1. Patient afebrile overnight. Will continue empiric antibiotics  Leukocytosis: Patient afebrile. Continue IV antibiotics. Follow labs in am  Anemia of chronic disease: Patient's hemoglobin is stable and consistent with his baseline.  There is no clinical indication for blood  transfusion at this time.  Chronic pain syndrome: Continue home medications     Code Status: Full Code Family Communication: N/A Disposition Plan: Not yet ready for discharge  Shaquanta Harkless Rennis Petty  APRN, MSN, FNP-C [facility] [address] [place], [place] [address] [phone]  If 5PM-8AM, please contact night-coverage.  02/07/2021, 5:53 PM  LOS: 5 days

## 2021-02-08 DIAGNOSIS — D5700 Hb-SS disease with crisis, unspecified: Secondary | ICD-10-CM | POA: Diagnosis not present

## 2021-02-08 LAB — CBC
HCT: 19.6 % — ABNORMAL LOW (ref 39.0–52.0)
Hemoglobin: 6.9 g/dL — CL (ref 13.0–17.0)
MCH: 28.8 pg (ref 26.0–34.0)
MCHC: 35.2 g/dL (ref 30.0–36.0)
MCV: 81.7 fL (ref 80.0–100.0)
Platelets: 358 10*3/uL (ref 150–400)
RBC: 2.4 MIL/uL — ABNORMAL LOW (ref 4.22–5.81)
RDW: 18.9 % — ABNORMAL HIGH (ref 11.5–15.5)
WBC: 15.5 10*3/uL — ABNORMAL HIGH (ref 4.0–10.5)
nRBC: 0.5 % — ABNORMAL HIGH (ref 0.0–0.2)

## 2021-02-08 MED ORDER — ACETAMINOPHEN 325 MG PO TABS: 650.0000 mg | ORAL_TABLET | Freq: Once | ORAL | Status: DC

## 2021-02-08 MED ORDER — SODIUM CHLORIDE 0.9% IV SOLUTION: Freq: Once | INTRAVENOUS | Status: DC

## 2021-02-08 MED ORDER — DIPHENHYDRAMINE HCL 50 MG/ML IJ SOLN: 25.0000 mg | Freq: Once | INTRAMUSCULAR | Status: DC

## 2021-02-08 NOTE — Progress Notes (Signed)
Wasted 13 mL of dilaudid PCA with Gaynelle Adu, RN.

## 2021-02-08 NOTE — Progress Notes (Signed)
Date: 02/08/2021 Patient: Andrew Kirk Admitted: 02/02/2021  6:02 PM Attending Provider: Quentin Angst, MD  Andrew Kirk has made the decision for the patient to leave the emergency department against the advice of Quentin Angst, MD and Julianne Handler, FNP.  He has been informed and understands the inherent risks, including death.  He has decided to accept the responsibility for this decision. Andrew Kirk and all necessary parties have been advised that he may return for further evaluation or treatment. His condition at time of discharge was Good.  Andrew Kirk had current vital signs as follows:  Blood pressure (!) 115/51, pulse 80, temperature 99 F (37.2 C), temperature source Oral, resp. rate 16, height 5\' 8"  (1.727 m), weight 74.8 kg, SpO2 100 %.   or his authorized caregiver has signed the Leaving Against Medical Advice form prior to leaving the department.  Andrew Kirk 02/08/2021

## 2021-02-08 NOTE — Discharge Summary (Signed)
Physician Discharge Summary  Crixus Mcaulay IRS:[id number] DOB: 01-13-2001 DOA: 02/02/2021  PCP: Patient, No Pcp Per (Inactive)  Admit date: 02/02/2021  Discharge date: 02/08/2021  Discharge Diagnoses:  Principal Problem:   Sickle cell pain crisis [facility]) Active Problems:   Influenza A   Discharge Condition: Stable  Disposition:   Left AGAINST MEDICAL ADVICE  Diet: Regular Wt Readings from Last 3 Encounters:  02/05/21 74.8 kg  02/02/21 74.8 kg  07/04/18 77.1 kg    History of present illness:   Andrew Kirk is a 20 year old male with a medical history significant for sickle cell anemia who presented to the emergency department for evaluation of sickle cell pain crisis.  Patient reports acute low back pain beginning yesterday which is similar to his prior sickle cell pain crisis episodes.  Pain occasionally radiates to his chest.  He is having hot spells but denies any chills or diaphoresis.  He denies any shortness of breath or cough.  He did not have any injury.  States that he usually takes oxycodone for pain (no prescriptions filled since 06/25/2020-oxycodone 5 mg #4 per review of [place] controlled substance database) but has not had it as he has not been seen at the sickle cell clinic for few months.  He tried ibuprofen without improvement and presented to [facility] ED for further management.  [facility] ED course: Initial vitals showed BP 120/71, pulse 63, respirations 16, temperature 97.5 F, oxygen saturation 94% on RA. Labs show WBCs 11.9, hemoglobin 8.5, platelets 291,000, reticulocytes 293, sodium 137, potassium 4.0, bicarb 24, BUN 11, creatinine 0.63, serum glucose 101, high-sensitivity troponin 23 >15. Influenza A positive.  SARS COVID-19 PCR is negative.  Two-view chest x-ray is negative for focal consolidation, edema, or effusion. Patient was given 2 L lactated Ringer's, IV Dilaudid, IV Toradol without significant improvement in pain.  The hospitalist service was consulted to admit to [facility] for further evaluation and management.  While in the ED, patient also received Dilaudid 2 mg x 2 and morphine 4 mg x 3.   Hospital Course:  Sickle cell disease with pain crisis: Patient was admitted for sickle cell pain crisis and managed appropriately with IVF, IV Dilaudid via PCA and IV Toradol, as well as other adjunct therapies per sickle cell pain management protocols.  IV Dilaudid PCA was weaned appropriately prior to patient leaving AGAINST MEDICAL ADVICE. On admission, patient tested positive for influenza A.  He completed Tamiflu 75 mg twice daily.  Today, patient's hemoglobin was 6.9 g/dL.  Discussed transfusing 1 unit PRBCs at length, patient expressed understanding and agreed to transfusion.  Patient eventually refused type and screen and decided to leave AGAINST MEDICAL ADVICE.  Prior to leaving, patient was hemodynamically stable per today's assessment.  Patient was therefore discharged home today in a hemodynamically stable condition.   Klye will follow-up with PCP within 1 week of this discharge. Phelix was counseled extensively about nonpharmacologic means of pain management, patient verbalized understanding and was appreciative of  the care received during this admission.   We discussed the need for good hydration, monitoring of hydration status, avoidance of heat, cold, stress, and infection triggers. We discussed the need to be adherent with taking Hydrea and other home medications. Patient was reminded of the need to seek medical attention immediately if any symptom of bleeding, anemia, or infection occurs.  Discharge Exam: Vitals:   02/08/21 0213 02/08/21 0625  BP: (!) 117/55 (!) 115/51  Pulse: 63 80  Resp: 16 16  Temp:  98 F (36.7 C) 99 F (37.2 C)  SpO2: 100% 100%   Vitals:   02/07/21 1826 02/07/21 2227 02/08/21 0213 02/08/21 0625  BP: 111/60 (!) 109/43 (!) 117/55 (!) 115/51  Pulse: 96 68 63 80  Resp: 15 14 16 16   Temp: (!) 101.3 F (38.5 C) 99.3 F  (37.4 C) 98 F (36.7 C) 99 F (37.2 C)  TempSrc: Oral Oral Oral Oral  SpO2: 99% 100% 100% 100%  Weight:      Height:        General appearance : Awake, alert, not in any distress. Speech Clear. Not toxic looking HEENT: Atraumatic and Normocephalic, pupils equally reactive to light and accomodation Neck: Supple, no JVD. No cervical lymphadenopathy.  Chest: Good air entry bilaterally, no added sounds  CVS: S1 S2 regular, no murmurs.  Abdomen: Bowel sounds present, Non tender and not distended with no gaurding, rigidity or rebound. Extremities: B/L Lower Ext shows no edema, both legs are warm to touch Neurology: Awake alert, and oriented X 3, CN II-XII intact, Non focal Skin: No Rash  Discharge Instructions    Left AGAINST MEDICAL ADVICE  The results of significant diagnostics from this hospitalization (including imaging, microbiology, ancillary and laboratory) are listed below for reference.    Significant Diagnostic Studies: DG Chest 2 View  Result Date: 02/06/2021 CLINICAL DATA:  Central chest pain and shortness of breath. Sickle cell disease. EXAM: CHEST - 2 VIEW COMPARISON:  Two-view chest x-ray 11/22/that two-view chest x-ray 02/02/2021 FINDINGS: Heart size is normal. New bilateral pleural effusions are present. Bibasilar airspace disease is noted, right greater than left. Upper lung fields are clear. IMPRESSION: New bilateral pleural effusions and bibasilar airspace disease, right greater than left. Findings could represent atelectasis or infection. Electronically Signed   By: Marin Roberts M.D.   On: 02/06/2021 13:15   DG Chest 2 View  Result Date: 02/02/2021 CLINICAL DATA:  Sickle cell pain. EXAM: CHEST - 2 VIEW COMPARISON:  Chest radiograph dated 02/15/2016. FINDINGS: The heart size and mediastinal contours are within normal limits. Both lungs are clear. The visualized skeletal structures are unremarkable. IMPRESSION: No active cardiopulmonary disease. Electronically  Signed   By: Elgie Collard M.D.   On: 02/02/2021 03:01    Microbiology: Recent Results (from the past 240 hour(s))  Resp Panel by RT-PCR (Flu A&B, Covid) Nasopharyngeal Swab     Status: Abnormal   Collection Time: 02/02/21  7:49 AM   Specimen: Nasopharyngeal Swab; Nasopharyngeal(NP) swabs in vial transport medium  Result Value Ref Range Status   SARS Coronavirus 2 by RT PCR NEGATIVE NEGATIVE Final    Comment: (NOTE) SARS-CoV-2 target nucleic acids are NOT DETECTED.  The SARS-CoV-2 RNA is generally detectable in upper respiratory specimens during the acute phase of infection. The lowest concentration of SARS-CoV-2 viral copies this assay can detect is 138 copies/mL. A negative result does not preclude SARS-Cov-2 infection and should not be used as the sole basis for treatment or other patient management decisions. A negative result may occur with  improper specimen collection/handling, submission of specimen other than nasopharyngeal swab, presence of viral mutation(s) within the areas targeted by this assay, and inadequate number of viral copies(<138 copies/mL). A negative result must be combined with clinical observations, patient history, and epidemiological information. The expected result is Negative.  Fact Sheet for Patients:  BloggerCourse.com  Fact Sheet for Healthcare Providers:  SeriousBroker.it  This test is no t yet approved or cleared by the [place] FDA and  has  been authorized for detection and/or diagnosis of SARS-CoV-2 by FDA under an Emergency Use Authorization (EUA). This EUA will remain  in effect (meaning this test can be used) for the duration of the COVID-19 declaration under Section 564(b)(1) of the Act, 21 U.S.C.section 360bbb-3(b)(1), unless the authorization is terminated  or revoked sooner.       Influenza A by PCR POSITIVE (A) NEGATIVE Final   Influenza B by PCR NEGATIVE NEGATIVE Final     Comment: (NOTE) The Xpert Xpress SARS-CoV-2/FLU/RSV plus assay is intended as an aid in the diagnosis of influenza from Nasopharyngeal swab specimens and should not be used as a sole basis for treatment. Nasal washings and aspirates are unacceptable for Xpert Xpress SARS-CoV-2/FLU/RSV testing.  Fact Sheet for Patients: BloggerCourse.com  Fact Sheet for Healthcare Providers: SeriousBroker.it  This test is not yet approved or cleared by the [place] FDA and has been authorized for detection and/or diagnosis of SARS-CoV-2 by FDA under an Emergency Use Authorization (EUA). This EUA will remain in effect (meaning this test can be used) for the duration of the COVID-19 declaration under Section 564(b)(1) of the Act, 21 U.S.C. section 360bbb-3(b)(1), unless the authorization is terminated or revoked.  Performed at [facility], [address] [place], [place] [address]   Culture, blood (Routine X 2) w Reflex to ID Panel     Status: None (Preliminary result)   Collection Time: 02/06/21 12:49 PM   Specimen: BLOOD RIGHT HAND  Result Value Ref Range Status   Specimen Description   Final    BLOOD RIGHT HAND Performed at [facility], [address], [place], [place] [address]    Special Requests   Final    BOTTLES DRAWN AEROBIC ONLY Blood Culture adequate volume Performed at [facility], [address] [place], [place] [address]    Culture   Final    NO GROWTH 2 DAYS Performed at [facility], [address], [place], [place] [address]    Report Status PENDING  Incomplete  Culture, blood (Routine X 2) w Reflex to ID Panel     Status: None (Preliminary result)   Collection Time: 02/06/21 12:49 PM   Specimen: BLOOD RIGHT HAND  Result Value Ref Range Status   Specimen Description   Final    BLOOD RIGHT HAND Performed at [facility], [address],  [place], [place] [address]    Special Requests   Final    BOTTLES DRAWN AEROBIC ONLY Blood Culture adequate volume Performed at [facility], [address] [place], [place] [address]    Culture   Final    NO GROWTH 2 DAYS Performed at [facility], [address] [place], [place] [address]    Report Status PENDING  Incomplete     Labs: Basic Metabolic Panel: Recent Labs  Lab 02/02/21 0320 02/03/21 0508 02/05/21 0558 02/06/21 1249 02/07/21 0528  NA 137 136 133* 135 137  K 4.0 3.9 3.5 3.3* 4.2  CL 108 103 100 100 104  CO2 24 23 25 25 25   GLUCOSE 101* 91 90 105* 91  BUN 11 7 7 7 7   CREATININE 0.63 0.51* 0.61 0.69 0.60*  CALCIUM 8.9 9.1 8.7* 8.8* 8.7*   Liver Function Tests: Recent Labs  Lab 02/05/21 0558 02/06/21 1249 02/07/21 0528  AST 25 17 20   ALT 14 14 14   ALKPHOS 56 50 51  BILITOT 2.0* 2.6* 1.4*  PROT 7.0 7.5  7.4  ALBUMIN 3.7 3.6 3.4*   No results for input(s): LIPASE, AMYLASE in the last 168 hours. No results for input(s): AMMONIA in the last 168 hours. CBC: Recent Labs  Lab 02/02/21 0320 02/03/21 0508 02/05/21 0558 02/06/21 0927 02/07/21 0528 02/08/21 0545  WBC 11.9* 13.7* 15.6* 16.2* 17.8* 15.5*  NEUTROABS 4.7 9.7* 12.0* 11.6*  --   --   HGB 8.5* 8.2* 7.3* 7.6* 7.0* 6.9*  HCT 23.8* 22.3* 21.0* 21.6* 19.8* 19.6*  MCV 86.5 82.9 83.0 81.8 81.1 81.7  PLT 291 282 257 309 323 358   Cardiac Enzymes: No results for input(s): CKTOTAL, CKMB, CKMBINDEX, TROPONINI in the last 168 hours. BNP: Invalid input(s): POCBNP CBG: No results for input(s): GLUCAP in the last 168 hours.  Time coordinating discharge: Patient left AGAINST MEDICAL ADVICE  Signed:  Julianne Handler  [facility] 02/08/2021, 1:34 PM

## 2021-02-09 ENCOUNTER — Telehealth: Payer: Self-pay

## 2021-02-09 NOTE — Telephone Encounter (Signed)
Transition Care Management Unsuccessful Follow-up Telephone Call  Date of discharge and from where:  02/08/2021 [place]  Attempts:  1st Attempt  Reason for unsuccessful TCM follow-up call:  Left voice message    

## 2021-02-10 NOTE — Telephone Encounter (Signed)
Transition Care Management Unsuccessful Follow-up Telephone Call  Date of discharge and from where:  02/08/2021 from [facility]  Attempts:  2nd Attempt  Reason for unsuccessful TCM follow-up call:  Left voice message

## 2021-02-11 LAB — CULTURE, BLOOD (ROUTINE X 2)
Culture: NO GROWTH
Culture: NO GROWTH
Special Requests: ADEQUATE
Special Requests: ADEQUATE

## 2021-02-13 NOTE — Telephone Encounter (Signed)
Transition Care Management Unsuccessful Follow-up Telephone Call  Date of discharge and from where:  02/08/2021 from [facility]  Attempts:  3rd Attempt  Reason for unsuccessful TCM follow-up call:  Unable to reach patient

## 2021-05-17 DIAGNOSIS — D571 Sickle-cell disease without crisis: Secondary | ICD-10-CM | POA: Diagnosis not present

## 2021-05-17 DIAGNOSIS — D5700 Hb-SS disease with crisis, unspecified: Secondary | ICD-10-CM | POA: Diagnosis not present

## 2021-06-19 DIAGNOSIS — D571 Sickle-cell disease without crisis: Secondary | ICD-10-CM | POA: Diagnosis not present

## 2021-12-14 ENCOUNTER — Telehealth: Payer: Self-pay

## 2021-12-14 NOTE — Telephone Encounter (Signed)
Patient called to connect with a Primary Care Provider. Unable to leave VM, VM not set up/VM full. Patient has Managed Medicaid. If patient returns call, please reach out to Sarah or Dacian Orrico, RN.   

## 2021-12-22 ENCOUNTER — Telehealth: Payer: Self-pay

## 2021-12-22 NOTE — Telephone Encounter (Signed)
Patient called to connect with a Primary Care Provider. Unable to leave VM, VM not set up/VM full. Patient has Managed Medicaid. If patient returns call, please reach out to Sarah or Tyton Abdallah, RN.   

## 2021-12-27 ENCOUNTER — Telehealth: Payer: Self-pay

## 2021-12-27 NOTE — Telephone Encounter (Signed)
Patient called to connect with a Primary Care Provider. Unable to leave VM, VM not set up/VM full. Patient has Managed Medicaid. If patient returns call, please reach out to Shanyce Daris or Leslie, RN.
